# Patient Record
Sex: Female | Born: 1965 | Hispanic: Yes | Marital: Single | Smoking: Never smoker
Health system: Southern US, Community
[De-identification: ages and names within clinical notes are randomized; demographics above are authoritative.]

## PROBLEM LIST (undated history)

## (undated) DIAGNOSIS — M79644 Pain in right finger(s): Secondary | ICD-10-CM

## (undated) DIAGNOSIS — J309 Allergic rhinitis, unspecified: Secondary | ICD-10-CM

## (undated) HISTORY — PX: OTHER SURGICAL HISTORY: SHX169

## (undated) HISTORY — DX: Allergic rhinitis, unspecified: J30.9

## (undated) HISTORY — PX: OVARIAN CYST REMOVAL: SHX89

---
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
## 1996-02-17 HISTORY — PX: BREAST LUMPECTOMY: SHX2

## 2013-01-09 ENCOUNTER — Encounter: Payer: Self-pay | Admitting: Family Medicine

## 2013-01-09 ENCOUNTER — Ambulatory Visit (INDEPENDENT_AMBULATORY_CARE_PROVIDER_SITE_OTHER): Payer: BC Managed Care – PPO | Admitting: Family Medicine

## 2013-01-09 VITALS — BP 120/81 | HR 87 | Ht 64.0 in | Wt 132.0 lb

## 2013-01-09 DIAGNOSIS — Z13220 Encounter for screening for lipoid disorders: Secondary | ICD-10-CM

## 2013-01-09 DIAGNOSIS — Z23 Encounter for immunization: Secondary | ICD-10-CM

## 2013-01-09 DIAGNOSIS — Z0000 Encounter for general adult medical examination without abnormal findings: Secondary | ICD-10-CM

## 2013-01-09 NOTE — Assessment & Plan Note (Signed)
Healthy 47 yr Female, no acute / chronic complaints  Plan: 1. Ordered future lab, fasting lipid panel (screen for hypercholesterolemia) - will calculate ASCVD risk, suspect low at this time (w/o significant risk factors) 2. Recommend local GYN to establish care w/ in future 3. Will need scheduled annual mammogram (2015) 4. Received influenza vaccine (01/09/13) 5. Recommend Td vs Tdap in 2015 (last in 2005)

## 2013-01-09 NOTE — Progress Notes (Signed)
Subjective:     Patient ID: Desiree Pruitt, female   DOB: [birth year], 47 y.o.   MRN: [id number]  New patient visit to establish care.  HPI  General Health Screening Presents today without any health complaints, interested in establishing care and meeting new doctor in area. Denies any acute or chronic medical problems. Does not take any rx medications, OTC, or supplements. Interested in preventative health and screening. Good health literacy, with multiple family members physicians (Dad retired Investment banker, operational, brother Development worker, international aid). Denies any h/o hypertension or hypercholesterolemia. Unsure last time a complete lipid panel checked. Lifestyle - exercise regularly (dance, swimming, yoga 1-3x weekly), diet (well-balanced)  Health Maintenance - Regular mammograms yearly - Hx regular pap smears (denies any abnormal results) - Interested in recommendation of GYN in area for future visits - Recently had annual physical completed in May 2014  Social Hx: Originally from [place]. Recently moved to [place] (from [place]). 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 - Denies any tobacco or drug use. Never smoker - Admits to occasional alcohol use infrequent wine / beer 2-3x weekly - Feels safe at home  PHQ-2 - negative  Review of Systems  Denies any recent illness, fever/chills, headache, chest pain, dyspnea, cough, vision problems, nausea / vomiting, abdominal pain, constipation / diarrhea, numbness / tingling, weakness, joint/muscle pain.     Objective:   Physical Exam  BP 120/81  Pulse 87  Ht 5\' 4"  (1.626 m)  Wt 132 lb (59.875 kg)  BMI 22.65 kg/m2  General - very pleasant, conversational, well-appearing 47 yr Female, NAD HEENT - PERRLA, EOMI, nares patent w/o congestion, pharynx clear, MMM Neck - supple, non-tender, no LAD Heart - RRR, no murmurs Lungs - CTAB Abd - soft, NTND, +active BS Ext - WWP, +2  peripheral pulses b/l Neuro - awake, alert, oriented, grossly non-focal, intact muscle strength 5/5, intact distal sensation to light touch, normal gait Skin - warm, dry, no rashes     Assessment:     See specific A&P problem list for details.      Plan:     See specific A&P problem list for details.

## 2013-01-09 NOTE — Patient Instructions (Signed)
Dear Lynett Grimes, Thank you for coming in to clinic today. It was good to meet you!  Today we discussed your general and preventative health. 1. I really enjoyed talking to you, and wish you well! 2. Your blood pressure is excellent, and there are no other indications for any labs or medicines at this time. 3. We will check a fasting cholesterol panel within 1 week. Please do not eat after midnight prior to your appointment. I will review the lab and send you a letter with the results. We may call you if there is any concern to discuss the results further if needed. 4. I will not make a GYN referral today, but will look into a good recommendation for you to get established with a local GYN. 5. Continue to exercise and eat healthy and   Some important numbers from today's visit: BP - 120/81  Please schedule a follow-up appointment with me in 6 months - general medical visit.   If you have any other questions or concerns, please feel free to call the clinic to contact me. You may also schedule an earlier appointment if necessary.  However, if your symptoms get significantly worse, please go to the Emergency Department to seek immediate medical attention.  Saralyn Pilar, DO [facility]

## 2013-02-28 ENCOUNTER — Encounter: Payer: Self-pay | Admitting: Family Medicine

## 2013-02-28 ENCOUNTER — Ambulatory Visit (INDEPENDENT_AMBULATORY_CARE_PROVIDER_SITE_OTHER): Payer: BC Managed Care – PPO | Admitting: Family Medicine

## 2013-02-28 VITALS — BP 124/82 | HR 98 | Temp 98.2°F | Ht 64.0 in | Wt 134.0 lb

## 2013-02-28 DIAGNOSIS — N898 Other specified noninflammatory disorders of vagina: Secondary | ICD-10-CM | POA: Insufficient documentation

## 2013-02-28 DIAGNOSIS — R197 Diarrhea, unspecified: Secondary | ICD-10-CM

## 2013-02-28 DIAGNOSIS — J309 Allergic rhinitis, unspecified: Secondary | ICD-10-CM

## 2013-02-28 DIAGNOSIS — J069 Acute upper respiratory infection, unspecified: Secondary | ICD-10-CM

## 2013-02-28 DIAGNOSIS — N9489 Other specified conditions associated with female genital organs and menstrual cycle: Secondary | ICD-10-CM

## 2013-02-28 DIAGNOSIS — B9789 Other viral agents as the cause of diseases classified elsewhere: Secondary | ICD-10-CM

## 2013-02-28 MED ORDER — FLUTICASONE PROPIONATE 50 MCG/ACT NA SUSP: 1.0000 | Freq: Every day | NASAL | Status: DC | PRN

## 2013-02-28 MED ORDER — BENZONATATE 100 MG PO CAPS: 100.0000 mg | ORAL_CAPSULE | Freq: Two times a day (BID) | ORAL | Status: DC | PRN

## 2013-02-28 NOTE — Assessment & Plan Note (Signed)
Symptoms consistent with persistent URI, related to nasal congestion / drip and secondary sore throat. Currently afebrile, no other red flags on exam.  Plan: 1. Start Fluticasone nasal spray daily x 2 week to 1 month 2. OTC Nasal saline, consider Claritin

## 2013-02-28 NOTE — Assessment & Plan Note (Signed)
Self-limited episode during recent acute illness, since resolved. Multiple days of dryness without improvement on arousal or intercourse. Currently peri-menopausal still with period. Suspect related to menopausal changes.  Plan: 1. Referral to GYN for establishing management. Patient had previously expressed interest in finding local GYN, as she has recently moved to area.

## 2013-02-28 NOTE — Patient Instructions (Signed)
Dear Desiree Pruitt, Thank you for coming in to clinic today. It was good to see you again!  Today we discussed your Recent Illnesses and Diarrhea. 1. It sounds like you've had more than 1 viral infection over the past few weeks. 2. Since your fever and diarrhea has stopped, I am reassured that you will not require any further treatment at this time. If the diarrhea returns, or you notice blood in stool or fever, then please call and come back and we can collect stool cultures at that time, and possibly start you on an antibiotic. 3. For your sore throat, cough, and nasal drip, I have given a rx for Flonase and Tessalon Perls for cough. You may also try an over the counter anti-histamine like Claritin. 4. I would recommend continuing the Flonase for about 2 weeks to 1 month. You may continue the saline spray. 5. For sore throat, please take Ibuprofen 200-400mg  every 4 to 6 hours as needed.  Please schedule a follow-up appointment with me as needed if your symptoms return.  If you have any other questions or concerns, please feel free to call the clinic to contact me. You may also schedule an earlier appointment if necessary.  However, if your symptoms get significantly worse, please go to the Emergency Department to seek immediate medical attention.  Nobie Putnam, DO  Family Medicine    Viral Gastroenteritis Viral gastroenteritis is also known as stomach flu. This condition affects the stomach and intestinal tract. It can cause sudden diarrhea and vomiting. The illness typically lasts 3 to 8 days. Most people develop an immune response that eventually gets rid of the virus. While this natural response develops, the virus can make you quite ill. CAUSES  Many different viruses can cause gastroenteritis, such as rotavirus or noroviruses. You can catch one of these viruses by consuming contaminated food or water. You may also catch a virus by sharing utensils or other personal items  with an infected person or by touching a contaminated surface. SYMPTOMS  The most common symptoms are diarrhea and vomiting. These problems can cause a severe loss of body fluids (dehydration) and a body salt (electrolyte) imbalance. Other symptoms may include:  Fever.  Headache.  Fatigue.  Abdominal pain. DIAGNOSIS  Your caregiver can usually diagnose viral gastroenteritis based on your symptoms and a physical exam. A stool sample may also be taken to test for the presence of viruses or other infections. TREATMENT  This illness typically goes away on its own. Treatments are aimed at rehydration. The most serious cases of viral gastroenteritis involve vomiting so severely that you are not able to keep fluids down. In these cases, fluids must be given through an intravenous line (IV). HOME CARE INSTRUCTIONS   Drink enough fluids to keep your urine clear or pale yellow. Drink small amounts of fluids frequently and increase the amounts as tolerated.  Ask your caregiver for specific rehydration instructions.  Avoid:  Foods high in sugar.  Alcohol.  Carbonated drinks.  Tobacco.  Juice.  Caffeine drinks.  Extremely hot or cold fluids.  Fatty, greasy foods.  Too much intake of anything at one time.  Dairy products until 24 to 48 hours after diarrhea stops.  You may consume probiotics. Probiotics are active cultures of beneficial bacteria. They may lessen the amount and number of diarrheal stools in adults. Probiotics can be found in yogurt with active cultures and in supplements.  Wash your hands well to avoid spreading the virus.  Only  take over-the-counter or prescription medicines for pain, discomfort, or fever as directed by your caregiver. Do not give aspirin to children. Antidiarrheal medicines are not recommended.  Ask your caregiver if you should continue to take your regular prescribed and over-the-counter medicines.  Keep all follow-up appointments as directed by  your caregiver. SEEK IMMEDIATE MEDICAL CARE IF:   You are unable to keep fluids down.  You do not urinate at least once every 6 to 8 hours.  You develop shortness of breath.  You notice blood in your stool or vomit. This may look like coffee grounds.  You have abdominal pain that increases or is concentrated in one small area (localized).  You have persistent vomiting or diarrhea.  You have a fever. MAKE SURE YOU:   Understand these instructions.  Will watch your condition.  Will get help right away if you are not doing well or get worse. Document Released: 02/02/2005 Document Revised: 04/27/2011 Document Reviewed: 11/19/2010 [facility] Patient Information 2014 [place].

## 2013-02-28 NOTE — Assessment & Plan Note (Signed)
Concern for infectious etiology given foreign travel ([place]) and febrile illness (however possible fever 2/2 viral illness). Currently diarrhea resolved x 2 days (regular BMs), no blood or mucus, residual abdominal cramping. No other red flags at this time. Likely viral vs self limited infectious colitis  Plan: 1. No indication for stool culture or o&p and no empiric antibiotics 2. If symptoms resume and persist, low threshold for RTC for stool culture / o&p. May consider empiric Cipro. Avoid anti-diarrheal agents 3. Continue probiotics 3.

## 2013-02-28 NOTE — Assessment & Plan Note (Addendum)
Recurrent viral URI, with noted improvements. Currently afebrile, no red flags on exam. No indication for throat swab today.  Plan: 1. No antibiotics at this time 2. Flonase, nasal saline, claritin, tessalon perls, ibuprofen PRN sore throat 3. RTC 1-2 weeks if not improved

## 2013-02-28 NOTE — Progress Notes (Signed)
Subjective:     Patient ID: Desiree Pruitt, female   DOB: 10-18-1965, 48 y.o.   MRN: [id number]  Patient presented for a same day visit.  HPI  URI SYMPTOMS: Reports recent intermittent course of URI symptoms starting 02/09/13 with cough, nasal congestion, reported fevers, and sore throat. Overall, URI symptoms improved prior to travel to [place], and then course returned recently on 02/23/13 with fever, sore throat, cough, and nasal congestion, has been without fever for past 2 days, noted improvement in sore throat with persistent cough worse at night. Tried over the counter anti-pyretic with some relief.  DIARRHEA: Currently has had no further episodes diarrhea for past 2 days, noted semi-solid regular appearing stools 1-2x daily, associated with improved abdominal cramping only before/during BM, asymptomatic between BMs. However, reported recent course of diarrhea (02/18/13 to 02/26/13) with frequent 3x episodes daily, mostly liquid, associated with urgency, also had fever. Concern with symptoms starting while in [place], however denies any friends/family with similar symptoms, and unable to isolate specific inciting food. Started Probiotics, previously ate a bland diet, has since tolerated more variety of foods, continues to increase fluid intake. Denies any blood, mucus, foul odor, or incontinence.  VAGINAL DRYNESS: Recent history of vaginal dryness for few days during course of recent diarrhea episode, especially when aroused and with intercourse, noted minimal natural vaginal lubrication compared to normal. Note she is perimenopausal and still gets her period. Within the past 1 week her symptoms of dryness have improved. Requesting GYN referral to establish care, as she is new to the area.  Social Hx: Never smoker  Travel Hx: to [place] (native) from 02/11/13 to 02/21/13, denies any specific exposures or concerning ingestions.  Review of Systems  As above HPI     Objective:   Physical  Exam  BP 124/82  Pulse 98  Temp(Src) 98.2 F (36.8 C) (Oral)  Ht 5\' 4"  (1.626 m)  Wt 134 lb (60.782 kg)  BMI 22.99 kg/m2  LMP 02/14/2013  Gen - well-appearing, NAD HEENT - no significant sinus tenderness, patent nares minimal congestion, b/l nasal turbinates red and swollen, oropharynx with post-nasal drip otherwise clear of erythema and no exudate, MMM Neck - supple, R-sided tenderness, no LAD Heart - RRR, no murmurs heard Lungs - CTAB, no wheezing, crackles, or rhonchi. Normal work of breathing. Abd - soft, NTND, no masses, +active BS Ext - non-tender, no edema, peripheral pulses intact +2 b/l Skin - warm, dry, no rashes Neuro - awake, alert, oriented, grossly non-focal, intact muscle strength 5/5 b/l, intact distal sensation to light touch, gait normal     Assessment:     See specific A&P problem list for details.      Plan:     See specific A&P problem list for details.

## 2013-03-01 ENCOUNTER — Telehealth: Payer: Self-pay | Admitting: Family Medicine

## 2013-03-01 NOTE — Telephone Encounter (Signed)
[facility] After Hours Line.   Patient called complaining of persistent sore throat worse on the right side, feels like concentrated on the tonsil on that side. Has tried 400mg  of ibuprofen twice a day for last 2 days but pain persists and has now developed right ear pain. Patient is afebrile and breathing comfortably. Discussed with patient needs repeat office evaluation and advised same day visit tomorrow. Also, advised taking 400mg  every 6 hours of ibuprofen.   Brayton Mars. Melanee Spry, MD, PGY-3 03/01/2013 7:45 PM

## 2013-03-02 ENCOUNTER — Ambulatory Visit (INDEPENDENT_AMBULATORY_CARE_PROVIDER_SITE_OTHER): Payer: BC Managed Care – PPO | Admitting: Family Medicine

## 2013-03-02 ENCOUNTER — Encounter: Payer: Self-pay | Admitting: Family Medicine

## 2013-03-02 VITALS — BP 125/88 | HR 86 | Temp 98.5°F | Ht 64.0 in | Wt 134.0 lb

## 2013-03-02 DIAGNOSIS — R070 Pain in throat: Secondary | ICD-10-CM

## 2013-03-02 DIAGNOSIS — J020 Streptococcal pharyngitis: Secondary | ICD-10-CM

## 2013-03-02 LAB — POCT RAPID STREP A (OFFICE): Rapid Strep A Screen: POSITIVE — AB

## 2013-03-02 MED ORDER — PENICILLIN V POTASSIUM 500 MG PO TABS: 500.0000 mg | ORAL_TABLET | Freq: Two times a day (BID) | ORAL | Status: DC

## 2013-03-02 MED ORDER — AMOXICILLIN 500 MG PO CAPS: 500.0000 mg | ORAL_CAPSULE | Freq: Two times a day (BID) | ORAL | Status: DC

## 2013-03-02 NOTE — Assessment & Plan Note (Addendum)
Rapid strep positive, clinically supportive with pharynx worsened today (compared to 02/28/13), inc erythema, edema, with mild exudates R>L, appears symmetric without evidence of peri-tonsilar abscess. +new worsened R-submandibular node Note age, +cough, afebrile, less supportive of strep throat. Also, given h/o prior Strep 10 yrs ago, pt may be colonizer, etiology could still be viral vs alternative bacteria  Plan: 1. Amoxicillin PO 500mg  BID x 10 days. Treat as GAS pharyngitis given +rapid strep, initially discussed using Pencillin VK (pt preference for antibiotics to minimize gut flora loss, but ultimately agreed for wider coverage for potential alternative pathogens). 2. Continue Ibuprofen 400mg  q 6 hr 3. Advised to call/RTC if no improvement in 3-4 days, consider broadening coverage

## 2013-03-02 NOTE — Progress Notes (Signed)
Subjective:     Patient ID: Desiree Pruitt, female   DOB: 07/03/1965, 48 y.o.   MRN: [id number]  HPI  SORE THROAT / RIGHT EAR PAIN Reports significant worsening R-ear / throat pain over past 48 hours, painful swallowing solids/liquids, limited diet to soup, recently seen by me in clinic on 02/28/13. Has been taking Ibuprofen 400mg  twice daily for 2 days with notable relief, and then since last night has been using it every 6 hours, but reports it wears off after about 3 hours. Note the pain woke her up overnight, and she had difficulty sleeping. Additionally, noticed new swollen painful lymph node on right anterior neck. Did not go to work today because of pain. Note recent diarrhea has remained resolved x 4 days now, and would prefer less harsh antibiotics that would not disturb her gut flora.  PMH: Reports prior history of Strep throat 10 years ago (last time she got flu shot)  Allergies: Note reports allergic reaction to unknown antibiotic in [place] years ago, with high fever and diarrhea. Believes she has had Penicillin before without problems, as she has never had any other problem with antibiotics, but cannot confirm.  Social Hx: Never smoker  Review of Systems  See above HPI Additionally: Admits cough (worse at night, relieved by Tessalon), congestion. Denies fever, chills, HA, CP, dyspnea, hoarseness, nausea, vomiting, diarrhea.    Objective:   Physical Exam  BP 125/88  Pulse 86  Temp(Src) 98.5 F (36.9 C) (Oral)  Ht 5\' 4"  (1.626 m)  Wt 134 lb (60.782 kg)  BMI 22.99 kg/m2  LMP 02/14/2013  Gen - awake, alert, pleasant, tired but well-appearing, NAD  HEENT - PERRL, bilateral TM's normal without erythema, effusion or bulging (note inc tenderness on R-otoscopic exam without evidence of ear canal lesions, external ear non-tender), bilateral mastoid processes non-tender without erythema, patent nares mild congestion, oropharynx with post-nasal drip otherwise clear of erythema  and no exudate, MMM  Neck - supple, Right anterior LAD with significant 1-2cm submandibular node, tender to palpation R>L Heart - RRR, no murmurs heard  Lungs - CTAB, no wheezing, crackles, or rhonchi. Normal work of breathing.  Skin - warm, dry, no rashes      Assessment:     See specific A&P problem list for details.      Plan:     See specific A&P problem list for details.

## 2013-03-02 NOTE — Patient Instructions (Addendum)
Dear Desiree Pruitt, Thank you for coming in to clinic today. It was good to see you again, sorry you are back so soon and not feeling better!  Today we discussed your Sore Throat and Ear Pain. 1. On exam, your throat does seem like it may be the source of your infection, your ear looked normal. 2. The rapid strep test was positive, indicating that you do have Strep Throat. 3. We will start with antibiotics today with Amoxicillin. 4. You may continue taking the Ibuprofen for pain.  We started a new medication today to treat your Strep Throat. Amoxicillin 500 mg, please take 1 tablet 2 times a day for 10 days. If you are not improving, please call the clinic in 3-4 days.  Please schedule a follow-up appointment with me or any available provider in 1 week if your symptoms are not improving.  [facility] [phone] Urgent Care [phone] Emergency Department [phone]  If you have any other questions or concerns, please feel free to call the clinic to contact me. You may also schedule an earlier appointment if necessary.  However, if your symptoms get significantly worse, please go to the Emergency Department to seek immediate medical attention.  Nobie Putnam, [place]

## 2013-03-09 ENCOUNTER — Encounter: Payer: Self-pay | Admitting: Obstetrics & Gynecology

## 2013-03-24 ENCOUNTER — Encounter: Payer: Self-pay | Admitting: Family Medicine

## 2013-03-24 ENCOUNTER — Ambulatory Visit (INDEPENDENT_AMBULATORY_CARE_PROVIDER_SITE_OTHER): Payer: BC Managed Care – PPO | Admitting: Family Medicine

## 2013-03-24 VITALS — BP 120/80 | HR 80 | Temp 98.6°F | Ht 64.0 in | Wt 134.4 lb

## 2013-03-24 DIAGNOSIS — J020 Streptococcal pharyngitis: Secondary | ICD-10-CM

## 2013-03-24 DIAGNOSIS — R070 Pain in throat: Secondary | ICD-10-CM

## 2013-03-24 LAB — POCT MONO (EPSTEIN BARR VIRUS): Mono, POC: NEGATIVE

## 2013-03-24 LAB — POCT RAPID STREP A (OFFICE): Rapid Strep A Screen: POSITIVE — AB

## 2013-03-24 MED ORDER — CEPHALEXIN 500 MG PO CAPS: 500.0000 mg | ORAL_CAPSULE | Freq: Two times a day (BID) | ORAL | Status: DC

## 2013-03-24 MED ORDER — FLUCONAZOLE 150 MG PO TABS
150.0000 mg | ORAL_TABLET | Freq: Once | ORAL | Status: DC
Start: 2013-03-24 — End: 2013-06-17

## 2013-03-24 NOTE — Patient Instructions (Signed)
Strep Throat  Strep throat is an infection of the throat caused by a bacteria named Streptococcus pyogenes. Your caregiver may call the infection streptococcal "tonsillitis" or "pharyngitis" depending on whether there are signs of inflammation in the tonsils or back of the throat. Strep throat is most common in children aged 47 15 years during the cold months of the year, but it can occur in people of any age during any season. This infection is spread from person to person (contagious) through coughing, sneezing, or other close contact.  SYMPTOMS   · Fever or chills.  · Painful, swollen, red tonsils or throat.  · Pain or difficulty when swallowing.  · White or yellow spots on the tonsils or throat.  · Swollen, tender lymph nodes or "glands" of the neck or under the jaw.  · Red rash all over the body (rare).  DIAGNOSIS   Many different infections can cause the same symptoms. A test must be done to confirm the diagnosis so the right treatment can be given. A "rapid strep test" can help your caregiver make the diagnosis in a few minutes. If this test is not available, a light swab of the infected area can be used for a throat culture test. If a throat culture test is done, results are usually available in a day or two.  TREATMENT   Strep throat is treated with antibiotic medicine.  HOME CARE INSTRUCTIONS   · Gargle with 1 tsp of salt in 1 cup of warm water, 3 4 times per day or as needed for comfort.  · Family members who also have a sore throat or fever should be tested for strep throat and treated with antibiotics if they have the strep infection.  · Make sure everyone in your household washes their hands well.  · Do not share food, drinking cups, or personal items that could cause the infection to spread to others.  · You may need to eat a soft food diet until your sore throat gets better.  · Drink enough water and fluids to keep your urine clear or pale yellow. This will help prevent dehydration.  · Get plenty of  rest.  · Stay home from school, daycare, or work until you have been on antibiotics for 24 hours.  · Only take over-the-counter or prescription medicines for pain, discomfort, or fever as directed by your caregiver.  · If antibiotics are prescribed, take them as directed. Finish them even if you start to feel better.  SEEK MEDICAL CARE IF:   · The glands in your neck continue to enlarge.  · You develop a rash, cough, or earache.  · You cough up green, yellow-brown, or bloody sputum.  · You have pain or discomfort not controlled by medicines.  · Your problems seem to be getting worse rather than better.  SEEK IMMEDIATE MEDICAL CARE IF:   · You develop any new symptoms such as vomiting, severe headache, stiff or painful neck, chest pain, shortness of breath, or trouble swallowing.  · You develop severe throat pain, drooling, or changes in your voice.  · You develop swelling of the neck, or the skin on the neck becomes red and tender.  · You have a fever.  · You develop signs of dehydration, such as fatigue, dry mouth, and decreased urination.  · You become increasingly sleepy, or you cannot wake up completely.  Document Released: 01/31/2000 Document Revised: 01/20/2012 Document Reviewed: 04/03/2010  [employer]® Patient Information ©2014 [employer].

## 2013-03-25 ENCOUNTER — Encounter: Payer: Self-pay | Admitting: Family Medicine

## 2013-03-25 NOTE — Progress Notes (Signed)
Patient ID: Desiree Pruitt    DOB: 1965-03-23, 48 y.o.   MRN: [id number] --- Subjective:  Desiree Pruitt is a 48 y.o.female who presents with recurrence of throat pain.  Patient was seen on 03/02/13 and found to be strep positive for which she was treated with amoxicillin. She reports finishing the course of antibiotics. Symptoms resolved after antibiotic treatment. She started feeling right sided throat pain the night prior to today's visit and she wanted to get checked out for it before it got any worst.  She states that pain is sharp, intermittent. Doesn't prevent her from swallowing or talking. No associated fevers. No weight loss. She denies any debilitating fatigue. No myalgias. No rhinorrhea or nasal congestion. No cough.  She also reports that for the last 2 months she has been sexually involved with a female partner with whom she has had oral sex and she expresses concern that it could be related. She states that they have both been tested about a month ago for STD's, including HIV and GC/Chl   ROS: see HPI Past Medical History: reviewed and updated medications and allergies. Social History: Tobacco: none  Objective: Filed Vitals:   03/24/13 1106  BP: 120/80  Pulse: 80  Temp: 98.6 F (37 C)    Physical Examination:   General appearance - alert, well appearing, and in no distress Ears - bilateral TM's and external ear canals normal Nose - normal and patent, no erythema, discharge or polyps Mouth - mucous membranes moist, oropharynx erythematous with cobblestone pattern, tonsils erythematous without exudate bilaterally Neck - supple, tender right cervical lymphadenopathy Chest - clear to auscultation, no wheezes, rales or rhonchi, symmetric air entry Heart - normal rate, regular rhythm, normal S1, S2, no murmurs

## 2013-03-25 NOTE — Assessment & Plan Note (Signed)
Strep test positive after initial treatment with amoxicillin.  This could be due to chronic carrier state or to recurrence of infection given that patient is symptomatic.  - mono spot checked to rule out mono as etiology of pharyngitis and negative - will treat with different antibiotic: keflex 500mg  bid for 10 days. If patient has recurrence of symptoms, she was instructed to return to clinic for referal to ENT for further evaluation.  - diflucan ordered in case of yeast infection which patient states she does have with antibiotics - encouraged probiotics for prevention of diarrhea with antibiotics which patient was concerned about

## 2013-04-13 ENCOUNTER — Encounter: Payer: BC Managed Care – PPO | Admitting: Obstetrics & Gynecology

## 2013-05-05 ENCOUNTER — Encounter: Payer: BC Managed Care – PPO | Admitting: Family Medicine

## 2013-05-24 ENCOUNTER — Ambulatory Visit (INDEPENDENT_AMBULATORY_CARE_PROVIDER_SITE_OTHER): Payer: BC Managed Care – PPO | Admitting: Obstetrics & Gynecology

## 2013-05-24 ENCOUNTER — Encounter: Payer: Self-pay | Admitting: Obstetrics & Gynecology

## 2013-05-24 VITALS — BP 111/78 | HR 85 | Temp 99.8°F | Ht 64.0 in | Wt 135.8 lb

## 2013-05-24 DIAGNOSIS — Z01419 Encounter for gynecological examination (general) (routine) without abnormal findings: Secondary | ICD-10-CM

## 2013-05-24 MED ORDER — FLUCONAZOLE 150 MG PO TABS: 150.0000 mg | ORAL_TABLET | Freq: Once | ORAL | Status: DC

## 2013-05-24 NOTE — Patient Instructions (Signed)
Breast Cancer, What You Should Know Breast cancer is one of the most common types of cancer in women. It is the second leading cause of death for all women.  The probability that breast cancer will return after treatment is directly related to the stage of its malignancy. This means how advanced the cancer is before it is found and treated. If breast cancer is found and treated early, before the cancer has spread to the lymph nodes, your chance for survival is much better. Delays in diagnosing or treating breast cancer can result in spread of the cancer. This happens when warning signs are ignored and proper measures for diagnosis or treatment are not taken. When breast cancer spreads beyond the breast to other parts of the body, staging is done in order to find out the extent of the cancer and to give the best treatment available. Today, there is a better chance of survival in the treatment of breast cancer. In the past 20 to 30 years, the diagnosis and treatment of breast cancer has greatly improved. There are more options for the type of surgery available (not just radical breast removal [mastectomy] and removal of lymph nodes in the armpit). There are also improved medicines, chemotherapy, radiation therapy, and breast reconstruction. These innovations have improved the survival rate and have helped with complications of breast cancer that were present in the past. Radiation, chemotherapy, and medicines may be used before or after surgery to shrink the tumor, kill any remaining cancer cells, and to prevent spreading and recurrence of the cancer. Research to develop new medicines, procedures, and combinations of treatments is constantly being done to help improve prevention, treatment, and to reduce recurrence of breast cancer. TYPES OF BREAST CANCER  In situ. Cancer is contained in the tumor and has not spread.  Invasive. Cancer has spread outside the tumor.  Inflammatory. The whole breast is red  (inflamed), painful, and swollen (rare).  Paget's disease. Cancer starts in the nipple and spreads to the areola (rare).  Breast cancer in the milk ducts.  Breast cancer in the milk lobules. Males can get breast cancer, but it is very rare. RISK FACTORS There are certain conditions and circumstances that place some women at risk for developing breast cancer. If you have any or several of these risks, you should be aware of them and take extra precautions to take better care of yourself. Some of these risk factors include:  Previous history of breast cancer.  Family history of breast cancer.  Abnormal genes present in your body (BRCA 1, BRCA 2, and HER-2).  Calcium deposits (calcifications) seen on your breast X-ray (mammogram).  Starting your menstrual periods before age 16 (early menarche).  Late menopause, at age 11 or older.  Heavy radiation exposure to the chest.  Cancer of the uterus, ovary, or intestine.  Drinking too much alcohol.  Never having a baby or breastfeeding.  Taking too much hormone treatment for too long.  Being very tall.  Being of Jewish descent.  Obesity.  Presence of estrogen or progesterone receptor cells in the breast.  Smoking. Low-dose birth control pills and fibrocystic disease of the breast are not thought to cause breast cancer. MANAGING BREAST PROBLEMS The following are steps that should be taken to avoid a bad outcome with a breast problem:  You should practice "breast self-awareness." This means understanding the normal appearance and feel of your breasts and may include breast self-exams. Any changes detected, no matter how small, should be reported to  your caregiver. Women in their 47s and 30s should have a clinical breast exam (CBE) by a caregiver as part of a regular health exam every 1 to 3 years. After age 47, women should have a CBE every year.  All lumps should be evaluated thoroughly by ultrasound, mammogram, magnetic resonance  imaging (MRI) scan, tissue sample (biopsy) exam, or removed and examined for cancer.  If you are told you have a breast infection, make sure you follow up with your caregiver until it gets better and goes away.  If you are told a tumor is benign (noncancerous), question the diagnosis and ask for a biopsy to make sure.  All tumors should be removed and examined for cancer.  Do not disregard sharp pains in your breast. Make sure you have an answer for what is causing the pain and follow up properly.  If you have signs of pulling in (retraction) of your nipple, make sure you understand the reason for this and that proper studies are done.  If you have signs of discharge from your nipple, especially blood, make sure you are told the reason for this and that proper tests are done.  If a needle aspiration biopsy is done and is negative, make sure you know when to follow up with this, and when repeat testing should be done. Get a second opinion if necessary.  Do not rely only on mammograms. Be sure to have regular self-exams and physical breast exams by your caregiver.  Have a mammogram done on a regular basis, or as recommended. The [place] has guidelines based on age and risk factors that may be present.  If there are findings from your mammogram, make sure you follow up properly. Make sure you know when additional testing needs to be done. You should know that:  Radiation from mammograms does not cause cancer or other problems to the breast, skin, heart, or lungs.  Other screening tests for the breast are ultrasound and MRI scans.  There are medicines available that may help prevent breast cancer or help prevent recurrence in women at high risk, 47 years old or older. These medicines block estrogen hormone from getting into the tumor:  Tamoxifen.  Raloxifene.  Trastuzumab.  Sometimes, the ovaries will be removed to decrease estrogen or progesterone hormone production.  These hormones may stimulate the growth, recurrence, or spread of breast cancer. Finding out the results of your test When you have breast testing done, ask when your test results will be ready. Make sure you get your test results. FOR MORE INFORMATION American Cancer Society: www.cancer.org Document Released: 05/13/2005 Document Revised: 04/27/2011 Document Reviewed: 12/20/2008 [facility] Patient Information 2014 [place], [place].

## 2013-05-24 NOTE — Progress Notes (Signed)
Patient ID: Desiree Pruitt, female   DOB: 1965-09-11, 48 y.o.   MRN: [id number] Subjective:     Desiree Pruitt is a 48 y.o. female here for a routine exam.  LMP 2 weeks prev  G0 Current complaints: freq yeast infxn.  Pt was on atbx Amox and Keflex x 2 and she began having freq yeast infxn. Started having intercourse without condoms recently.  Had STI testing prior to intercourse without condoms.  This is a new partner since having female only relationships for 15 years.        Gynecologic History No LMP recorded. Contraception: vasectomy Last Pap: 2014. Results were: normal Last mammogram: 2014. Results were: normal  Obstetric History OB History  No data available     The following portions of the patient's history were reviewed and updated as appropriate: allergies, current medications, past family history, past medical history, past social history, past surgical history and problem list.  Review of Systems Genitourinary:negative, recurent yeast infections after 2 courses of atbx    Objective:    BP 111/78  Pulse 85  Temp(Src) 99.8 F (37.7 C) (Oral)  Ht 5\' 4"  (1.626 m)  Wt 135 lb 12.8 oz (61.598 kg)  BMI 23.30 kg/m2  General Appearance:    Alert, cooperative, no distress, appears stated age                 Neck:   Supple, symmetrical, trachea midline, no adenopathy;    thyroid:  no enlargement/tenderness/nodules; no carotid   bruit or JVD  Back:     Symmetric, no curvature, ROM normal, no CVA tenderness  Lungs:     Clear to auscultation bilaterally, respirations unlabored  Chest Wall:    No tenderness or deformity   Heart:    Regular rate and rhythm, S1 and S2 normal, no murmur, rub   or gallop  Breast Exam:    No tenderness, masses, or nipple abnormality  Abdomen:     Soft, non-tender, bowel sounds active all four quadrants,    no masses, no organomegaly  Genitalia:    Normal female without lesion, discharge or tenderness  Rectal:    Normal tone, normal  prostate, no masses or tenderness;   guaiac negative stool  Extremities:   Extremities normal, atraumatic, no cyanosis or edema  Pulses:   2+ and symmetric all extremities  Skin:   Skin color, texture, turgor normal, no rashes or lesions            Assessment:    Healthy female exam.    Plan:    Follow up in: 3 months.  to check and see if yeast has resolved.  Pt told to cancel visit if her sx have resolved Diflucan 150mg  1 po q 3 days x3

## 2013-05-24 NOTE — Progress Notes (Signed)
Pt. Here for annual exam today and has a few concerns: frequent yeast infections, "once a month," one two weeks ago and has one now.C/o of cottage cheese like discharge. Pt. Reports being tested recently for GC/ch. Also c/o of painful intercourse at different times in cycle.

## 2013-06-12 ENCOUNTER — Ambulatory Visit (HOSPITAL_COMMUNITY)
Admission: RE | Admit: 2013-06-12 | Discharge: 2013-06-12 | Disposition: A | Discharge status: Home / Self Care | Payer: BC Managed Care – PPO | Source: Ambulatory Visit | Attending: Obstetrics & Gynecology | Admitting: Obstetrics & Gynecology

## 2013-06-12 DIAGNOSIS — Z01419 Encounter for gynecological examination (general) (routine) without abnormal findings: Secondary | ICD-10-CM

## 2013-06-12 DIAGNOSIS — Z1231 Encounter for screening mammogram for malignant neoplasm of breast: Secondary | ICD-10-CM | POA: Insufficient documentation

## 2013-06-17 ENCOUNTER — Ambulatory Visit (INDEPENDENT_AMBULATORY_CARE_PROVIDER_SITE_OTHER): Payer: BC Managed Care – PPO | Admitting: Family Medicine

## 2013-06-17 VITALS — BP 112/70 | HR 87 | Temp 98.7°F | Resp 16 | Ht 64.0 in | Wt 137.0 lb

## 2013-06-17 DIAGNOSIS — N390 Urinary tract infection, site not specified: Secondary | ICD-10-CM

## 2013-06-17 DIAGNOSIS — R399 Unspecified symptoms and signs involving the genitourinary system: Secondary | ICD-10-CM

## 2013-06-17 LAB — POCT URINALYSIS DIPSTICK
Bilirubin, UA: NEGATIVE
Glucose, UA: NEGATIVE
Ketones, UA: NEGATIVE
Nitrite, UA: NEGATIVE
Protein, UA: NEGATIVE
Spec Grav, UA: <=1.005
Urobilinogen, UA: 0.2
pH, UA: 5.5

## 2013-06-17 LAB — POCT UA - MICROSCOPIC ONLY
Bacteria, U Microscopic: NEGATIVE
Casts, Ur, LPF, POC: NEGATIVE
Crystals, Ur, HPF, POC: NEGATIVE
Mucus, UA: NEGATIVE
Yeast, UA: NEGATIVE

## 2013-06-17 MED ORDER — CIPROFLOXACIN HCL 250 MG PO TABS
250.0000 mg | ORAL_TABLET | Freq: Two times a day (BID) | ORAL | Status: DC
Start: 2013-06-17 — End: 2013-11-26

## 2013-06-17 NOTE — Patient Instructions (Signed)
Urinary Tract Infection  Urinary tract infections (UTIs) can develop anywhere along your urinary tract. Your urinary tract is your body's drainage system for removing wastes and extra water. Your urinary tract includes two kidneys, two ureters, a bladder, and a urethra. Your kidneys are a pair of bean-shaped organs. Each kidney is about the size of your fist. They are located below your ribs, one on each side of your spine.  CAUSES  Infections are caused by microbes, which are microscopic organisms, including fungi, viruses, and bacteria. These organisms are so small that they can only be seen through a microscope. Bacteria are the microbes that most commonly cause UTIs.  SYMPTOMS   Symptoms of UTIs may vary by age and gender of the patient and by the location of the infection. Symptoms in young women typically include a frequent and intense urge to urinate and a painful, burning feeling in the bladder or urethra during urination. Older women and men are more likely to be tired, shaky, and weak and have muscle aches and abdominal pain. A fever may mean the infection is in your kidneys. Other symptoms of a kidney infection include pain in your back or sides below the ribs, nausea, and vomiting.  DIAGNOSIS  To diagnose a UTI, your caregiver will ask you about your symptoms. Your caregiver also will ask to provide a urine sample. The urine sample will be tested for bacteria and white blood cells. White blood cells are made by your body to help fight infection.  TREATMENT   Typically, UTIs can be treated with medication. Because most UTIs are caused by a bacterial infection, they usually can be treated with the use of antibiotics. The choice of antibiotic and length of treatment depend on your symptoms and the type of bacteria causing your infection.  HOME CARE INSTRUCTIONS   If you were prescribed antibiotics, take them exactly as your caregiver instructs you. Finish the medication even if you feel better after you  have only taken some of the medication.   Drink enough water and fluids to keep your urine clear or pale yellow.   Avoid caffeine, tea, and carbonated beverages. They tend to irritate your bladder.   Empty your bladder often. Avoid holding urine for long periods of time.   Empty your bladder before and after sexual intercourse.   After a bowel movement, women should cleanse from front to back. Use each tissue only once.  SEEK MEDICAL CARE IF:    You have back pain.   You develop a fever.   Your symptoms do not begin to resolve within 3 days.  SEEK IMMEDIATE MEDICAL CARE IF:    You have severe back pain or lower abdominal pain.   You develop chills.   You have nausea or vomiting.   You have continued burning or discomfort with urination.  MAKE SURE YOU:    Understand these instructions.   Will watch your condition.   Will get help right away if you are not doing well or get worse.  Document Released: 11/12/2004 Document Revised: 08/04/2011 Document Reviewed: 03/13/2011  [employer] Patient Information 2014 [employer].

## 2013-06-17 NOTE — Progress Notes (Signed)
° °  Subjective:    Patient ID: Desiree Pruitt, female    DOB: February 10, 1966, 48 y.o.   MRN: [id number]  HPI HPI Comments: Desiree Pruitt is a 48 y.o. female who presents to the [facility] complaining of urinary urgency and frequency, ongoing for one week. Patient reports that her symptoms, including, cloudy colored urine, and a tinging pain in her lower abdomen after urinating, and abdominal cramping. Patient denies burning with urination.  Patient shares history of single UTI at the age of 50.   Patient reports recent, repeated yeast infections that have seemingly resolved. After those symptoms cleared her current symptoms presented. The patient reports initially ignoring them until last night when it dawned on her that the symptoms where characteristic of a UTI.   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   Review of Systems  Gastrointestinal: Positive for abdominal pain.  Genitourinary: Positive for urgency and frequency.       Objective:   Physical Exam No acute distress, articulate woman Abdomen: Soft nontender without HSM or masses CVA: Nontender Results for orders placed in visit on 03/24/13  POCT RAPID STREP A (OFFICE)      Result Value Ref Range   Rapid Strep A Screen Positive (*) Negative  POCT MONO (EPSTEIN BARR VIRUS)      Result Value Ref Range   Mono, POC Negative  Negative         Assessment & Plan:   1. UTI symptoms    UTI symptoms - Plan: Urinalysis Dipstick, POCT UA - Microscopic Only, ciprofloxacin (CIPRO) 250 MG tablet, Urine culture  Signed, Robyn Haber, MD

## 2013-06-19 LAB — URINE CULTURE: Colony Count: >=100000

## 2013-11-26 ENCOUNTER — Ambulatory Visit (INDEPENDENT_AMBULATORY_CARE_PROVIDER_SITE_OTHER): Payer: BC Managed Care – PPO | Admitting: Physician Assistant

## 2013-11-26 VITALS — BP 108/78 | HR 88 | Temp 98.5°F | Resp 18 | Ht 64.0 in | Wt 138.4 lb

## 2013-11-26 DIAGNOSIS — R300 Dysuria: Secondary | ICD-10-CM

## 2013-11-26 DIAGNOSIS — N3090 Cystitis, unspecified without hematuria: Secondary | ICD-10-CM

## 2013-11-26 LAB — POCT URINALYSIS DIPSTICK
Bilirubin, UA: NEGATIVE
Glucose, UA: NEGATIVE
Ketones, UA: NEGATIVE
Nitrite, UA: POSITIVE
Protein, UA: NEGATIVE
Spec Grav, UA: 1.015
Urobilinogen, UA: 0.2
pH, UA: 6.5

## 2013-11-26 LAB — POCT UA - MICROSCOPIC ONLY
Casts, Ur, LPF, POC: NEGATIVE
Crystals, Ur, HPF, POC: NEGATIVE
Yeast, UA: NEGATIVE

## 2013-11-26 MED ORDER — CIPROFLOXACIN HCL 500 MG PO TABS: 500.0000 mg | ORAL_TABLET | Freq: Two times a day (BID) | ORAL | Status: DC

## 2013-11-26 NOTE — Progress Notes (Signed)
Subjective:    Patient ID: Desiree Pruitt, female    DOB: [birth year], 48 y.o.   MRN: [id number]  Urinary Frequency  Associated symptoms include frequency and hematuria. Pertinent negatives include no chills, flank pain, nausea or vomiting.     48 year old non-smoking female is here today for chief complaint of dysuria.  Last night, while urinating she felt a strong pain in her suprapubic area as well as at her urethra.  She notes that the urine was cloudy.  Pain directly after urination that resolves in minutes as well as the sensation of still having to urinate again.  She denies fever, back pain, nausea, or vomiting.   She took advil last night which helped.  Not experiencing any vaginal symptoms.  She states that she has never had any vaginal dryness.  She is sexually active with a long-distance relationship with a female who just came to visit last Wednesday, 11/22/2013.  She does note the last UTI occurring in May was after a sexual encounter.  She endorses not urinating after sexual intercourse.  Unsure of last LMP, though it was a few weeks ago.     Review of Systems  Constitutional: Negative for fever and chills.  Gastrointestinal: Negative for nausea and vomiting.  Genitourinary: Positive for dysuria, frequency, hematuria and pelvic pain. Negative for flank pain, vaginal discharge and vaginal pain.       Objective:   Physical Exam  Constitutional: She is oriented to person, place, and time. She appears well-developed and well-nourished. No distress.  Eyes: Conjunctivae are normal.  Cardiovascular: Normal rate, regular rhythm and normal heart sounds.   Pulmonary/Chest: Effort normal and breath sounds normal. No respiratory distress. She has no wheezes.  Abdominal: Soft. Bowel sounds are normal. There is tenderness (suprapubic tenderness). There is no CVA tenderness.  Neurological: She is alert and oriented to person, place, and time.  Psychiatric: She has a normal mood and  affect. Her behavior is normal. Judgment and thought content normal.   BP 108/78  Pulse 88  Temp(Src) 98.5 F (36.9 C) (Oral)  Resp 18  Ht 5\' 4"  (1.626 m)  Wt 138 lb 6.4 oz (62.778 kg)  BMI 23.74 kg/m2  SpO2 100%  LMP 11/12/2013   Results for orders placed in visit on 11/26/13  POCT URINALYSIS DIPSTICK      Result Value Ref Range   Color, UA yellow     Clarity, UA cloudy     Glucose, UA neg     Bilirubin, UA neg     Ketones, UA neg     Spec Grav, UA 1.015     Blood, UA large     pH, UA 6.5     Protein, UA neg     Urobilinogen, UA 0.2     Nitrite, UA positive     Leukocytes, UA large (3+)    POCT UA - MICROSCOPIC ONLY      Result Value Ref Range   WBC, Ur, HPF, POC TNTC     RBC, urine, microscopic 10-15     Bacteria, U Microscopic 2+     Mucus, UA trace     Epithelial cells, urine per micros 2-4     Crystals, Ur, HPF, POC neg     Casts, Ur, LPF, POC neg     Yeast, UA neg           Assessment & Plan:  48 year old female is here today with chief complaint of dysuria.  UA is positive for blood along with nitrites more aligned with cystitis.    Dysuria - Plan: POCT urinalysis dipstick, POCT UA - Microscopic Only, Urine culture, ciprofloxacin (CIPRO) 500 MG tablet  Will follow up with patient of urine culture results.   Advised patient of lifestyle changes regarding sexual intercourse.      Ivar Drape, PA-C [facility] 10/11/201510:18 AM

## 2013-11-26 NOTE — Patient Instructions (Signed)
-  Stay hydrated. -Remember to urinate after intercourse.

## 2013-11-26 NOTE — Progress Notes (Signed)
I was directly involved with the patient's care and agree with the physical, diagnosis and treatment plan.  

## 2013-11-29 LAB — URINE CULTURE: Colony Count: >=100000

## 2013-11-30 NOTE — Progress Notes (Signed)
lmom for pt to cb

## 2014-02-24 ENCOUNTER — Ambulatory Visit (INDEPENDENT_AMBULATORY_CARE_PROVIDER_SITE_OTHER): Payer: BC Managed Care – PPO

## 2014-02-24 ENCOUNTER — Ambulatory Visit (INDEPENDENT_AMBULATORY_CARE_PROVIDER_SITE_OTHER): Payer: BC Managed Care – PPO | Admitting: Emergency Medicine

## 2014-02-24 ENCOUNTER — Ambulatory Visit (HOSPITAL_BASED_OUTPATIENT_CLINIC_OR_DEPARTMENT_OTHER)
Admission: RE | Admit: 2014-02-24 | Discharge: 2014-02-24 | Disposition: A | Discharge status: Home / Self Care | Payer: BC Managed Care – PPO | Source: Ambulatory Visit | Attending: Emergency Medicine | Admitting: Emergency Medicine

## 2014-02-24 VITALS — BP 128/74 | HR 86 | Temp 98.5°F | Resp 16 | Ht 63.5 in | Wt 142.0 lb

## 2014-02-24 DIAGNOSIS — M79605 Pain in left leg: Secondary | ICD-10-CM | POA: Insufficient documentation

## 2014-02-24 DIAGNOSIS — S90121A Contusion of right lesser toe(s) without damage to nail, initial encounter: Secondary | ICD-10-CM

## 2014-02-24 DIAGNOSIS — S8012XA Contusion of left lower leg, initial encounter: Secondary | ICD-10-CM

## 2014-02-24 DIAGNOSIS — W19XXXA Unspecified fall, initial encounter: Secondary | ICD-10-CM | POA: Diagnosis not present

## 2014-02-24 DIAGNOSIS — M7989 Other specified soft tissue disorders: Secondary | ICD-10-CM | POA: Diagnosis not present

## 2014-02-24 DIAGNOSIS — Z23 Encounter for immunization: Secondary | ICD-10-CM

## 2014-02-24 MED ORDER — MUPIROCIN 2 % EX OINT: TOPICAL_OINTMENT | CUTANEOUS | Status: DC

## 2014-02-24 NOTE — Addendum Note (Signed)
Addended by: Arlyss Queen A on: 02/24/2014 02:36 PM   Modules accepted: Orders

## 2014-02-24 NOTE — Progress Notes (Signed)
   Subjective:    Patient ID: Desiree Pruitt, female    DOB: 1965-12-08, 49 y.o.   MRN: [id number]  HPI    Review of Systems     Objective:   Physical Exam        Assessment & Plan:

## 2014-02-24 NOTE — Progress Notes (Addendum)
This chart was scribed for Nena Jordan, MD by Einar Pheasant, ED Scribe. This patient was seen in room 8 and the patient's care was started at 11:51 AM.  Subjective:    Patient ID: Desiree Pruitt, female    DOB: 1965/07/15, 49 y.o.   MRN: [id number]  HPI Desiree Pruitt is a 49 y.o. female with no pertinent medical PMhx listed below.  Today, pt presents to the office complaining of a left foot injury that occurred while on a trip in [place]. Pt was trying to cross a river when she tripped and fell. She scraped her left lateral/anterior leg on a rock and scraped her right big toe on another rock. Positive numbness to the lateral side of her left leg. She suffered a break in the skin on the great right toe and she is concerned for a possible infection. There is a mild area of erythema to the affected foot. She states that she just returned on the 2nd. Yesterday, she states that she started feeling the blood "draining" down her leg.  Pt does not recall the date of her last tetanus vaccine. Denies any fever, chills, nausea, emesis, abdominal pain, HA, weakness, possibility of pregnancy, or fatigue.    Patient Active Problem List   Diagnosis Date Noted  . Routine general medical examination at a health care facility 01/09/2013   History reviewed. No pertinent past medical history. Past Surgical History  Procedure Laterality Date  . Ovarian cyst removal Left    Allergies  Allergen Reactions  . Monistat [Miconazole] Rash    burning   Prior to Admission medications   Not on File   History   Social History  . Marital Status: Single    Spouse Name: N/A    Number of Children: N/A  . Years of Education: N/A   Occupational History  . Not on file.   Social History Main Topics  . Smoking status: Never Smoker   . Smokeless tobacco: Never Used  . Alcohol Use: 1.2 oz/week    2 Glasses of wine per week  . Drug Use: No  . Sexual Activity: Yes    Birth Control/ Protection: None    Other Topics Concern  . Not on file   Social History Narrative     Review of Systems  Constitutional: Negative for fatigue and unexpected weight change.  Respiratory: Negative for chest tightness and shortness of breath.   Cardiovascular: Negative for chest pain, palpitations and leg swelling.  Gastrointestinal: Negative for abdominal pain and blood in stool.  Skin: Positive for color change.  Neurological: Negative for dizziness, syncope, light-headedness and headaches.       Objective:   Physical Exam  Constitutional: She appears well-developed and well-nourished. No distress.  HENT:  Head: Normocephalic and atraumatic.  Eyes: Conjunctivae are normal. Right eye exhibits no discharge. Left eye exhibits no discharge.  Neck: Neck supple.  Cardiovascular: Normal rate, regular rhythm and normal heart sounds.  Exam reveals no gallop and no friction rub.   No murmur heard. Pulmonary/Chest: Effort normal and breath sounds normal. No respiratory distress.  Abdominal: Soft. She exhibits no distension. There is no tenderness.  Musculoskeletal: She exhibits no edema or tenderness.  Right great toe: the medial portion of the tissue adjecent to the nail is cracked and slightly red.   Left leg: there is a 8.0 x 4.0 inch area of ecchymosis lateral mid lower leg with some bruising noted around the ankle.  Neurological: She is alert.  Skin: Skin is warm and dry.  Psychiatric: She has a normal mood and affect. Her behavior is normal. Thought content normal.  Nursing note and vitals reviewed.   Filed Vitals:   02/24/14 1144  BP: 128/74  Pulse: 86  Temp: 98.5 F (36.9 C)  TempSrc: Oral  Resp: 16  Height: 5' 3.5" (1.613 m)  Weight: 142 lb (64.411 kg)  SpO2: 99%   [facility] reading (PRIMARY) by  Dr.Bernardino Dowell there is sclerosis of the posterior heel on foot film but the ankle tib-fib and toe do not show any signs of fracture       Assessment & Plan:  We'll treat the toe with Bactroban.  She was advised to get stockings with good support to help with swelling. If she has worsening or develops pain in the back of the leg she is to call and we will schedule Doppler of that leg.I personally performed the services describWe'll ultrasound the left calf.ed in this documentation, which was scribed in my presence. The recorded information has been reviewed and is accurate. Doppler study is negative. We'll treat with a support stocking she had Bactroban sent to use for the abraded area on her right great toe.

## 2014-07-09 ENCOUNTER — Telehealth: Payer: Self-pay

## 2014-07-09 NOTE — Telephone Encounter (Signed)
Talked to Desiree Pruitt. She will take care of this.

## 2014-07-09 NOTE — Telephone Encounter (Signed)
Patient says she is playing phone tag with Dr Tamala Julian. She says Dr Tamala Julian is calling her about a bill (that she does not have) and she is very angry. I do not see any documentation from any prior phone calls.

## 2015-01-12 ENCOUNTER — Ambulatory Visit (INDEPENDENT_AMBULATORY_CARE_PROVIDER_SITE_OTHER): Payer: BC Managed Care – PPO | Admitting: Internal Medicine

## 2015-01-12 VITALS — BP 116/80 | HR 80 | Temp 99.1°F | Resp 16 | Ht 63.5 in | Wt 144.8 lb

## 2015-01-12 DIAGNOSIS — J029 Acute pharyngitis, unspecified: Secondary | ICD-10-CM | POA: Diagnosis not present

## 2015-01-12 LAB — POCT RAPID STREP A (OFFICE): Rapid Strep A Screen: NEGATIVE

## 2015-01-12 MED ORDER — AMOXICILLIN 875 MG PO TABS: 875.0000 mg | ORAL_TABLET | Freq: Two times a day (BID) | ORAL | Status: DC

## 2015-01-12 NOTE — Progress Notes (Addendum)
   Subjective:  This chart was scribed for Desiree Lin, MD by Desiree Pruitt, ED Scribe. This patient was seen in room 1o and the patient's care was started at 2:05 PM.   Patient ID: Desiree Pruitt, female    DOB: [birth year], 49 y.o.   MRN: [id number]  HPI   Chief Complaint  Patient presents with  . Sore Throat    x 2 days    HPI Comments: Desiree Pruitt is a 49 y.o. female who presents to the [facility] complaining of left sided sore throat that began 2 days ago. She reports associated swollen lymph nodes and some neck stiffness. She has hx of strep throat that she states started with similar symptoms. 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 No cough and nasal congestion.  History reviewed. No pertinent past medical history. Allergies  Allergen Reactions  . Monistat [Miconazole] Rash    burning   Prior to Admission medications   Medication Sig Start Date End Date Taking? Authorizing Provider  mupirocin ointment (BACTROBAN) 2 % Apply a Pruitt amount of cream to the abraded area on your toe twice a day Patient not taking: Reported on 01/12/2015 02/24/14   Darlyne Russian, MD   Review of Systems  Constitutional: Negative for fever and chills.  HENT: Negative for congestion.   Respiratory: Negative for cough.   Hematological: Positive for adenopathy.       Objective:   Physical Exam  Constitutional: She is oriented to person, place, and time. She appears well-developed and well-nourished. No distress.  HENT:  Head: Normocephalic and atraumatic.  Mouth/Throat: Oropharyngeal exudate and posterior oropharyngeal erythema present.  Eyes: Conjunctivae and EOM are normal.  Neck: Neck supple.  Cardiovascular: Normal rate.   Pulmonary/Chest: Effort normal.  Musculoskeletal: Normal range of motion.  Lymphadenopathy:  Tender 2+ AC nodes.  Neurological: She is alert and oriented to person, place, and time.  Skin: Skin is warm and dry.    Psychiatric: She has a normal mood and affect. Her behavior is normal.  Nursing note and vitals reviewed.  Filed Vitals:   01/12/15 1341  BP: 116/80  Pulse: 80  Temp: 99.1 F (37.3 C)  TempSrc: Oral  Resp: 16  Height: 5' 3.5" (1.613 m)  Weight: 144 lb 12.8 oz (65.681 kg)  SpO2: 98%   Results for orders placed or performed in visit on 01/12/15  POCT rapid strep A  Result Value Ref Range   Rapid Strep A Screen Negative Negative   Assessment & Plan:   1. Acute pharyngitis, unspecified etiology     Orders Placed This Encounter  Procedures  . Culture, Group A Strep  . POCT rapid strep A   Meds ordered this encounter  Medications  . amoxicillin (AMOXIL) 875 MG tablet    Sig: Take 1 tablet (875 mg total) by mouth 2 (two) times daily.    Dispense:  20 tablet    Refill:  0      By signing my name below, I, Desiree Pruitt, attest that this documentation has been prepared under the direction and in the presence of Desiree Lin, MD.  Electronically Signed: Thea Pruitt, ED Scribe. 01/12/2015. 2:21 PM.  I have completed the patient encounter in its entirety as documented by the scribe, with editing by me where necessary. Desiree Pruitt P. Laney Pastor, M.D.    Add Str cult neg

## 2015-01-13 LAB — CULTURE, GROUP A STREP: Organism ID, Bacteria: NORMAL

## 2015-01-17 ENCOUNTER — Telehealth: Payer: Self-pay | Admitting: *Deleted

## 2015-01-17 ENCOUNTER — Telehealth: Payer: Self-pay

## 2015-01-17 NOTE — Telephone Encounter (Signed)
Advised pt that culture was negative and that she could continue her antibiotic.  She still want to know what you had to say about this result of what caused her sore throat.

## 2015-01-17 NOTE — Telephone Encounter (Signed)
Pt called about Strep cultures connected with clinical TL for status update.

## 2015-01-18 NOTE — Telephone Encounter (Signed)
Coxsackie virus likely, so she could stop antibiotic at this point

## 2015-01-21 NOTE — Telephone Encounter (Signed)
Left message for pt to call back  °

## 2015-02-22 ENCOUNTER — Ambulatory Visit (INDEPENDENT_AMBULATORY_CARE_PROVIDER_SITE_OTHER): Payer: BC Managed Care – PPO | Admitting: Family Medicine

## 2015-02-22 VITALS — BP 118/72 | HR 89 | Temp 98.9°F | Resp 18 | Ht 65.0 in | Wt 146.0 lb

## 2015-02-22 DIAGNOSIS — R591 Generalized enlarged lymph nodes: Secondary | ICD-10-CM

## 2015-02-22 DIAGNOSIS — M25579 Pain in unspecified ankle and joints of unspecified foot: Secondary | ICD-10-CM | POA: Diagnosis not present

## 2015-02-22 DIAGNOSIS — R5081 Fever presenting with conditions classified elsewhere: Secondary | ICD-10-CM

## 2015-02-22 DIAGNOSIS — B349 Viral infection, unspecified: Secondary | ICD-10-CM

## 2015-02-22 DIAGNOSIS — R599 Enlarged lymph nodes, unspecified: Secondary | ICD-10-CM

## 2015-02-22 LAB — POCT CBC
Granulocyte percent: 77.4 %G (ref 37–80)
HCT, POC: 41 % (ref 37.7–47.9)
Hemoglobin: 14 g/dL (ref 12.2–16.2)
Lymph, poc: 1 (ref 0.6–3.4)
MCH, POC: 29.4 pg (ref 27–31.2)
MCHC: 34.2 g/dL (ref 31.8–35.4)
MCV: 86 fL (ref 80–97)
MID (cbc): 0.4 (ref 0–0.9)
MPV: 7.5 fL (ref 0–99.8)
POC Granulocyte: 5 (ref 2–6.9)
POC LYMPH PERCENT: 16.3 %L (ref 10–50)
POC MID %: 6.3 %M (ref 0–12)
Platelet Count, POC: 249 10*3/uL (ref 142–424)
RBC: 4.77 M/uL (ref 4.04–5.48)
RDW, POC: 12.9 %
WBC: 6.4 10*3/uL (ref 4.6–10.2)

## 2015-02-22 LAB — COMPLETE METABOLIC PANEL WITH GFR
ALT: 12 U/L (ref 6–29)
AST: 14 U/L (ref 10–35)
Albumin: 4.3 g/dL (ref 3.6–5.1)
Alkaline Phosphatase: 50 U/L (ref 33–115)
BUN: 9 mg/dL (ref 7–25)
CO2: 28 mmol/L (ref 20–31)
Calcium: 9.3 mg/dL (ref 8.6–10.2)
Chloride: 100 mmol/L (ref 98–110)
Creat: 0.88 mg/dL (ref 0.50–1.10)
GFR, Est African American: >89 mL/min (ref >=60)
GFR, Est Non African American: 78 mL/min (ref >=60)
Glucose, Bld: 106 mg/dL — ABNORMAL HIGH (ref 65–99)
Potassium: 3.9 mmol/L (ref 3.5–5.3)
Sodium: 137 mmol/L (ref 135–146)
Total Bilirubin: 0.9 mg/dL (ref 0.2–1.2)
Total Protein: 7.6 g/dL (ref 6.1–8.1)

## 2015-02-22 LAB — POCT RAPID STREP A (OFFICE): Rapid Strep A Screen: NEGATIVE

## 2015-02-22 LAB — POCT INFLUENZA A/B
Influenza A, POC: NEGATIVE
Influenza B, POC: NEGATIVE

## 2015-02-22 NOTE — Progress Notes (Signed)
Chief Complaint:  Chief Complaint  Patient presents with  . Fever    since yesterday     HPI: Desiree Pruitt is a 50 y.o. female who reports to [facility] today complaining of fever and chills and and joint pain, diffuse since yesterday.  She was in [place] recently over 10 days during [place], she was bitten by mosquitoes, fevers and chills and jt pain, no nause vomiting dairrhea, no rashes, no urianry sxs, no blood in stool or urine   Has not had flu vaccine this year.  Partner is also sick with similar sxs. No family hx of inflammatory bowel disease or any inflammatory arthropathies or autoimmune disorders  History reviewed. No pertinent past medical history. Past Surgical History  Procedure Laterality Date  . Ovarian cyst removal Left    Social History   Social History  . Marital Status: Single    Spouse Name: N/A  . Number of Children: N/A  . Years of Education: N/A   Social History Main Topics  . Smoking status: Never Smoker   . Smokeless tobacco: Never Used  . Alcohol Use: 1.2 oz/week    2 Glasses of wine per week  . Drug Use: No  . Sexual Activity: Yes    Birth Control/ Protection: None   Other Topics Concern  . None   Social History Narrative   Family History  Problem Relation Age of Onset  . Cancer Mother     breast  . Hypertension Father   . Heart disease Father   . Cancer Maternal Grandmother     breast   Allergies  Allergen Reactions  . Monistat [Miconazole] Rash    burning   Prior to Admission medications   Medication Sig Start Date End Date Taking? Authorizing Provider  amoxicillin (AMOXIL) 875 MG tablet Take 1 tablet (875 mg total) by mouth 2 (two) times daily. Patient not taking: Reported on 02/22/2015 01/12/15   Leandrew Koyanagi, MD  mupirocin ointment [facility]) 2 % Apply a small amount of cream to the abraded area on your toe twice a day Patient not taking: Reported on 01/12/2015 02/24/14   Darlyne Russian, MD     ROS: The patient  denies chills, night sweats, unintentional weight loss, chest pain, palpitations, wheezing, dyspnea on exertion, nausea, vomiting, abdominal pain, dysuria, hematuria, melena, numbness, weakness, or tingling.   All other systems have been reviewed and were otherwise negative with the exception of those mentioned in the HPI and as above.    PHYSICAL EXAM: Filed Vitals:   02/22/15 1142  BP: 118/72  Pulse: 89  Temp: 98.9 F (37.2 C)  Resp: 18   Body mass index is 24.3 kg/(m^2).   General: Alert, no acute distress HEENT:  Normocephalic, atraumatic, oropharynx patent. EOMI, PERRLA, TM normal  No enlarged thyroid Cardiovascular:  Regular rate and rhythm, no rubs murmurs or gallops.  No Carotid bruits, radial pulse intact. No pedal edema.  Respiratory: Clear to auscultation bilaterally.  No wheezes, rales, or rhonchi.  No cyanosis, no use of accessory musculature Abdominal: No organomegaly, abdomen is soft and non-tender, positive bowel sounds. No masses. Skin: No rashes. Neurologic: Facial musculature symmetric. Psychiatric: Patient acts appropriately throughout our interaction. Lymphatic: No cervical or submandibular lymphadenopathy Musculoskeletal: Gait intact. No edema, tenderness 5/5 strength, full ROM of neck, neg for nuchal rigidity    LABS: Results for orders placed or performed in visit on 02/22/15  POCT CBC  Result Value Ref Range  WBC 6.4 4.6 - 10.2 K/uL   Lymph, poc 1.0 0.6 - 3.4   POC LYMPH PERCENT 16.3 10 - 50 %L   MID (cbc) 0.4 0 - 0.9   POC MID % 6.3 0 - 12 %M   POC Granulocyte 5.0 2 - 6.9   Granulocyte percent 77.4 37 - 80 %G   RBC 4.77 4.04 - 5.48 M/uL   Hemoglobin 14.0 12.2 - 16.2 g/dL   HCT, POC 41.0 37.7 - 47.9 %   MCV 86.0 80 - 97 fL   MCH, POC 29.4 27 - 31.2 pg   MCHC 34.2 31.8 - 35.4 g/dL   RDW, POC 12.9 %   Platelet Count, POC 249 142 - 424 K/uL   MPV 7.5 0 - 99.8 fL  POCT Influenza A/B  Result Value Ref Range   Influenza A, POC Negative Negative     Influenza B, POC Negative Negative  POCT rapid strep A  Result Value Ref Range   Rapid Strep A Screen Negative Negative     EKG/XRAY:   Primary read interpreted by Dr. Marin Comment at [facility].   ASSESSMENT/PLAN: Encounter Diagnoses  Name Primary?  . Swollen gland Yes  . Fever presenting with conditions classified elsewhere   . Pain in joint, ankle and foot, unspecified laterality    Likely viral illness Labs are reassuring  Will get CMP to make sure all is ok, if no improvement with otc ibuprofen /tylenol and fluid sin 24-48 hrs then will do further workup.   Gross sideeffects, risk and benefits, and alternatives of medications d/w patient. Patient is aware that all medications have potential sideeffects and we are unable to predict every sideeffect or drug-drug interaction that may occur.  Thao Le DO  02/22/2015 2:07 PM

## 2015-02-25 ENCOUNTER — Telehealth: Payer: Self-pay | Admitting: Family Medicine

## 2015-02-25 DIAGNOSIS — N83209 Unspecified ovarian cyst, unspecified side: Secondary | ICD-10-CM | POA: Insufficient documentation

## 2015-02-25 NOTE — Telephone Encounter (Signed)
Labs are normal.

## 2016-01-16 ENCOUNTER — Ambulatory Visit (INDEPENDENT_AMBULATORY_CARE_PROVIDER_SITE_OTHER): Payer: BC Managed Care – PPO | Admitting: Physician Assistant

## 2016-01-16 VITALS — BP 100/66 | HR 81 | Temp 98.3°F | Resp 18 | Ht 65.0 in | Wt 145.0 lb

## 2016-01-16 DIAGNOSIS — G4700 Insomnia, unspecified: Secondary | ICD-10-CM

## 2016-01-16 DIAGNOSIS — F439 Reaction to severe stress, unspecified: Secondary | ICD-10-CM | POA: Diagnosis not present

## 2016-01-16 MED ORDER — CLONAZEPAM 0.5 MG PO TABS: 0.5000 mg | ORAL_TABLET | Freq: Two times a day (BID) | ORAL | 0 refills | Status: DC | PRN

## 2016-01-16 NOTE — Progress Notes (Signed)
Lane  MRN: [id number] DOB: 04/15/1965  Subjective:  Pt presents to clinic for discussion of recent stressful situations and resulting anxiety.  She has been seeing an individual therapist who has recommended medication for her.  She is having stress in all aspects of her life - marriage, work with trying to get tenure, father is sick and having surgery in a week, going home to be with parents who do not approve of her lifestyle.    therapist - [facility] - Cleta Alberts - individual therapy - feels like it is helping  Dad having heart surgery on Monday in [place] - leaving for [place] 12/9 to be with her family - going for 2 weeks --   Hard last 1.5 year with wife - screaming fights - they have [facility] - emotional focus therapy - wife is threatening to leave and then comes back which is very stressful - they are not sleeping in the same room because they are having a hard time communicating - she finds that they get into discussions that are not always helpful right before bed which causes a later bedtime and also stress that disrupts her sleep.  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 Negative for dysphoric mood. The patient is nervous/anxious.     Patient Active Problem List   Diagnosis Date Noted  . Routine general medical examination at a health care facility 01/09/2013    No current outpatient prescriptions on file prior to visit.   No current facility-administered medications on file prior to  visit.     Allergies  Allergen Reactions  . Monistat [Miconazole] Rash    burning    Pt patients past, family and social history were reviewed and updated.   Objective:  BP 100/66 (BP Location: Right Arm, Patient Position: Sitting, Cuff Size: Small)   Pulse 81   Temp 98.3 F (36.8 C) (Oral)   Resp 18   Ht 5\' 5"  (1.651 m)   Wt 145 lb (65.8 kg)   LMP 01/02/2016   SpO2 98%   BMI 24.13 kg/m   Physical Exam  Constitutional: She is oriented to person, place, and time and well-developed, well-nourished, and in no distress.  HENT:  Head: Normocephalic and atraumatic.  Right Ear: Hearing and external ear normal.  Left Ear: Hearing and external ear normal.  Eyes: Conjunctivae are normal.  Neck: Normal range of motion.  Pulmonary/Chest: Effort normal.  Neurological: She is alert and oriented to person, place, and time. Gait normal.  Skin: Skin is warm and dry.  Psychiatric: Mood, memory, affect and judgment normal.  Vitals reviewed.  Spent 30 mins with pt with >50% in counseling Assessment and Plan :  Stress at home - Plan: clonazePAM (KLONOPIN) 0.5 MG tablet  Insomnia, unspecified type - Plan: clonazePAM (KLONOPIN) 0.5 MG tablet   This is a short term situation stressor with adjustment disorder.  Pt has stress in every aspect of her  life  - at this time long term medications do not make sense as we will treat with short acting medications to allow her to get through the situation as she expects this to be improved in the next month or so - sleep is a problem for her so hopefully as we get to sleep to occur she will be improved during the day - she will continue therapy and work on no discussions late at night as this is a problems with sleep disruption - we discussed the nature of the medications that I Rx for her and she understands the risks associated with them.  She will start with a low dose to see how they affect her and she will f/u with my through mychart if she has  questions - otherwise I will see her in a month when she returns from [place].    Windell Hummingbird PA-C  [facility] 01/18/2016 7:39 AM

## 2016-01-16 NOTE — Patient Instructions (Signed)
     IF you received an x-ray today, you will receive an invoice from  Radiology. Please contact  Radiology at [phone] with questions or concerns regarding your invoice.   IF you received labwork today, you will receive an invoice from [facility]. Please contact [facility] at [phone] with questions or concerns regarding your invoice.   Our billing staff will not be able to assist you with questions regarding bills from these companies.  You will be contacted with the lab results as soon as they are available. The fastest way to get your results is to activate your My Chart account. Instructions are located on the last page of this paperwork. If you have not heard from us regarding the results in 2 weeks, please contact this office.      

## 2016-01-17 ENCOUNTER — Encounter: Payer: Self-pay | Admitting: Physician Assistant

## 2016-06-24 ENCOUNTER — Ambulatory Visit: Payer: BC Managed Care – PPO | Admitting: Emergency Medicine

## 2016-07-10 ENCOUNTER — Encounter: Payer: Self-pay | Admitting: Obstetrics and Gynecology

## 2016-07-10 ENCOUNTER — Other Ambulatory Visit (HOSPITAL_COMMUNITY)
Admission: RE | Admit: 2016-07-10 | Discharge: 2016-07-10 | Disposition: A | Discharge status: Home / Self Care | Payer: BC Managed Care – PPO | Source: Ambulatory Visit | Attending: Obstetrics and Gynecology | Admitting: Obstetrics and Gynecology

## 2016-07-10 ENCOUNTER — Ambulatory Visit (INDEPENDENT_AMBULATORY_CARE_PROVIDER_SITE_OTHER): Payer: BC Managed Care – PPO | Admitting: Obstetrics and Gynecology

## 2016-07-10 VITALS — BP 102/70 | HR 68 | Resp 16 | Ht 64.0 in | Wt 144.0 lb

## 2016-07-10 DIAGNOSIS — Z01419 Encounter for gynecological examination (general) (routine) without abnormal findings: Secondary | ICD-10-CM

## 2016-07-10 DIAGNOSIS — Z803 Family history of malignant neoplasm of breast: Secondary | ICD-10-CM | POA: Diagnosis not present

## 2016-07-10 DIAGNOSIS — Z1211 Encounter for screening for malignant neoplasm of colon: Secondary | ICD-10-CM

## 2016-07-10 DIAGNOSIS — R1030 Lower abdominal pain, unspecified: Secondary | ICD-10-CM

## 2016-07-10 LAB — POCT URINALYSIS DIPSTICK
Bilirubin, UA: NEGATIVE
Blood, UA: NEGATIVE
Glucose, UA: NEGATIVE
Ketones, UA: NEGATIVE
Leukocytes, UA: NEGATIVE
Nitrite, UA: NEGATIVE
Protein, UA: NEGATIVE
Urobilinogen, UA: 0.2 E.U./dL
pH, UA: 7 (ref 5.0–8.0)

## 2016-07-10 NOTE — Progress Notes (Signed)
51 y.o. G0P0000 Single Caucasian female here as a new patient for an annual exam. Patient also complains of lower abdominal burning pain that extends to the anus. Patient was referred by a friend.   Patient has this pain infrequently.  Once every 4 - 6 months. It is a burning pain below her Pfannenstiel incision that radiates to the anal region.  It lingers down to the rectum before it goes away. Lasts about 30 minutes. It is better now.  Had a BM this am and then pain occurred some time later. No diarrhea.  No dysuria.  Did see GI but did not see anyone for it. Had H Pylori exam which was negative. Wonders if the has lactose or gluten intolerance.  Menses are regular. Not particularly painful.  Mother dx with breast cancer in early 58s.  She had recurrences and bilateral involvement.  GM was 65 with her dx of breast cancer.  Wants labs but not sure what her insurance will cover.  Going to [place] to settle her father's final estate.  Urine dip:  Negative.  PCP: Windell Hummingbird, PA  Patient's last menstrual period was 06/16/2016 (within weeks).     Period Cycle (Days): 28 Period Duration (Days): 3 Period Pattern: Regular Menstrual Flow: Moderate Menstrual Control: Tampon, Maxi pad Menstrual Control Change Freq (Hours): 4-5 Dysmenorrhea: (!) Mild Dysmenorrhea Symptoms: Cramping (occasional cramping)     Sexually active: Yes.    The current method of family planning is none and female partner.    Exercising: Yes.    gym twice a week Smoker:  no  Health Maintenance: Pap:  Patient unsure if pap was done with AEX 2017 at [place] of abnormal Pap:  no MMG:  2017 normal per patient -- done with Charlesetta Ivory -- scheduled today with [facility] Colonoscopy:  Not done BMD:   n/a  Result  n/a TDaP:  2016 Gardasil:   n/a HIV: 2015 per patient negative Hep C: 2015 per patient negative Screening Labs:  Hb today: discuss today, Urine today: urine sample given.     reports that she has never smoked. She has never used smokeless tobacco. She reports that she drinks about 1.2 oz of alcohol per week . She reports that she does not use drugs.  History reviewed. No pertinent past medical history.  Past Surgical History:  Procedure Laterality Date  . breast duct biopsy Left   . OVARIAN CYST REMOVAL Left     No current outpatient prescriptions on file.   No current facility-administered medications for this visit.     Family History  Problem Relation Age of Onset  . Cancer Mother        breast  . Hypertension Father   . Heart disease Father   . Cancer Maternal Grandmother        breast    ROS:  Pertinent items are noted in HPI.  Otherwise, a comprehensive ROS was negative.  Exam:   BP 102/70 (BP Location: Right Arm, Patient Position: Sitting, Cuff Size: Normal)   Pulse 68   Resp 16   Ht 5\' 4"  (1.626 m)   Wt 144 lb (65.3 kg)   LMP 06/16/2016 (Within Weeks)   BMI 24.72 kg/m     General appearance: alert, cooperative and appears stated age Head: Normocephalic, without obvious abnormality, atraumatic Neck: no adenopathy, supple, symmetrical, trachea midline and thyroid normal to inspection and palpation Lungs: clear to auscultation bilaterally Breasts: normal appearance, no masses or tenderness, No nipple  retraction or dimpling, No nipple discharge or bleeding, No axillary or supraclavicular adenopathy Heart: regular rate and rhythm Abdomen: Pfannenstiel incision, soft, non-tender; no masses, no organomegaly Extremities: extremities normal, atraumatic, no cyanosis or edema Skin: Skin color, texture, turgor normal. No rashes or lesions Lymph nodes: Cervical, supraclavicular, and axillary nodes normal. No abnormal inguinal nodes palpated Neurologic: Grossly normal  Pelvic: External genitalia:  no lesions              Urethra:  normal appearing urethra with no masses, tenderness or lesions              Bartholins and Skenes: normal                  Vagina: normal appearing vagina with normal color and discharge, no lesions              Cervix: no lesions              Pap taken: Yes.   Bimanual Exam:  Uterus:  normal size, contour, position, consistency, mobility, non-tender              Adnexa: no mass, fullness, tenderness              Rectal exam: Yes.  .  Confirms.              Anus:  normal sphincter tone, no lesions  Chaperone was present for exam.  Assessment:   Well woman visit with normal exam. FH breast cancer mother and mat GM. Status post left ovarian cyst removal.  Lower abdominal pain.  Unclear etiology.  Plan: Mammogram screening discussed. Recommended self breast awareness. Pap and HR HPV as above. Guidelines for Calcium, Vitamin D, regular exercise program including cardiovascular and weight bearing exercise. Will do fasting labs when she returns for pelvic ultrasound.  Referral to Dr. Collene Mares for colonoscopy and lower abdominal pain. Referral for genetic counseling and testing.  Follow up annually and prn.    After visit summary provided.

## 2016-07-10 NOTE — Patient Instructions (Signed)

## 2016-07-14 ENCOUNTER — Telehealth: Payer: Self-pay | Admitting: *Deleted

## 2016-07-14 ENCOUNTER — Telehealth: Payer: Self-pay | Admitting: Obstetrics and Gynecology

## 2016-07-14 LAB — CYTOLOGY - PAP
Diagnosis: NEGATIVE
HPV: NOT DETECTED

## 2016-07-14 NOTE — Telephone Encounter (Signed)
Spoke with patient regarding benefit for an ultrasound. Patient understood and agreeable. Patient ready to schedule. Patient scheduled 07/16/16 with Dr Quincy Simmonds. Patient aware of date, arrival time and cancellation policy. Confirmed with patient she can have her fasting lab draw at the time of this appointment. Patient had no further questions.   Routing to Dr Quincy Simmonds

## 2016-07-14 NOTE — Telephone Encounter (Signed)
Thank you for the update.  Encounter closed. 

## 2016-07-14 NOTE — Telephone Encounter (Signed)
Opened in error, will close encounter

## 2016-07-16 ENCOUNTER — Ambulatory Visit (INDEPENDENT_AMBULATORY_CARE_PROVIDER_SITE_OTHER): Payer: BC Managed Care – PPO | Admitting: Obstetrics and Gynecology

## 2016-07-16 ENCOUNTER — Encounter: Payer: Self-pay | Admitting: Obstetrics and Gynecology

## 2016-07-16 ENCOUNTER — Ambulatory Visit (INDEPENDENT_AMBULATORY_CARE_PROVIDER_SITE_OTHER): Payer: BC Managed Care – PPO

## 2016-07-16 VITALS — BP 110/66 | HR 70 | Ht 64.0 in | Wt 144.0 lb

## 2016-07-16 DIAGNOSIS — Z0000 Encounter for general adult medical examination without abnormal findings: Secondary | ICD-10-CM | POA: Diagnosis not present

## 2016-07-16 DIAGNOSIS — D259 Leiomyoma of uterus, unspecified: Secondary | ICD-10-CM | POA: Diagnosis not present

## 2016-07-16 DIAGNOSIS — Z0189 Encounter for other specified special examinations: Secondary | ICD-10-CM

## 2016-07-16 DIAGNOSIS — R1030 Lower abdominal pain, unspecified: Secondary | ICD-10-CM

## 2016-07-16 DIAGNOSIS — R102 Pelvic and perineal pain: Secondary | ICD-10-CM

## 2016-07-16 DIAGNOSIS — Z01419 Encounter for gynecological examination (general) (routine) without abnormal findings: Secondary | ICD-10-CM

## 2016-07-16 LAB — CBC
HCT: 39.7 % (ref 35.0–45.0)
Hemoglobin: 12.9 g/dL (ref 11.7–15.5)
MCH: 28.2 pg (ref 27.0–33.0)
MCHC: 32.5 g/dL (ref 32.0–36.0)
MCV: 86.7 fL (ref 80.0–100.0)
MPV: 10.3 fL (ref 7.5–12.5)
Platelets: 284 10*3/uL (ref 140–400)
RBC: 4.58 MIL/uL (ref 3.80–5.10)
RDW: 13.5 % (ref 11.0–15.0)
WBC: 5.8 10*3/uL (ref 3.8–10.8)

## 2016-07-16 LAB — COMPREHENSIVE METABOLIC PANEL
ALT: 10 U/L (ref 6–29)
AST: 14 U/L (ref 10–35)
Albumin: 4.2 g/dL (ref 3.6–5.1)
Alkaline Phosphatase: 40 U/L (ref 33–115)
BUN: 12 mg/dL (ref 7–25)
CO2: 24 mmol/L (ref 20–31)
Calcium: 9.3 mg/dL (ref 8.6–10.2)
Chloride: 104 mmol/L (ref 98–110)
Creat: 0.9 mg/dL (ref 0.50–1.10)
Glucose, Bld: 83 mg/dL (ref 65–99)
Potassium: 4.9 mmol/L (ref 3.5–5.3)
Sodium: 139 mmol/L (ref 135–146)
Total Bilirubin: 0.7 mg/dL (ref 0.2–1.2)
Total Protein: 7.2 g/dL (ref 6.1–8.1)

## 2016-07-16 LAB — LIPID PANEL
Cholesterol: 200 mg/dL — ABNORMAL HIGH (ref <200)
HDL: 84 mg/dL (ref >50)
LDL Cholesterol: 103 mg/dL — ABNORMAL HIGH (ref <100)
Total CHOL/HDL Ratio: 2.4 Ratio (ref <5.0)
Triglycerides: 66 mg/dL (ref <150)
VLDL: 13 mg/dL (ref <30)

## 2016-07-16 NOTE — Patient Instructions (Signed)
Uterine Fibroids Uterine fibroids are tissue masses (tumors). They are also called leiomyomas. They can develop inside of a woman's womb (uterus). They can grow very large. Fibroids are not cancerous (benign). Most fibroids do not require medical treatment. Follow these instructions at home:  Keep all follow-up visits as told by your doctor. This is important.  Take medicines only as told by your doctor. ? If you were prescribed a hormone treatment, take the hormone medicines exactly as told. ? Do not take aspirin. It can cause bleeding.  Ask your doctor about taking iron pills and increasing the amount of dark green, leafy vegetables in your diet. These actions can help to boost your blood iron levels.  Pay close attention to your period. Tell your doctor about any changes, such as: ? Increased blood flow. This may require you to use more pads or tampons than usual per month. ? A change in the number of days that your period lasts per month. ? A change in symptoms that come with your period, such as back pain or cramping in your belly area (abdomen). Contact a doctor if:  You have pain in your back or the area between your hip bones (pelvic area) that is not controlled by medicines.  You have pain in your abdomen that is not controlled with medicines.  You have an increase in bleeding between and during periods.  You soak tampons or pads in a half hour or less.  You feel lightheaded.  You feel extra tired.  You feel weak. Get help right away if:  You pass out (faint).  You have a sudden increase in pelvic pain. This information is not intended to replace advice given to you by your health care provider. Make sure you discuss any questions you have with your health care provider. Document Released: 03/07/2010 Document Revised: 10/04/2015 Document Reviewed: 08/01/2013 Elsevier Interactive Patient Education  2018 [employer]  

## 2016-07-16 NOTE — Progress Notes (Signed)
110/66Patient ID: Desiree Pruitt, female   DOB: [birth year], 51 y.o.   MRN: [id number] GYNECOLOGY  VISIT   HPI: 51 y.o.   Single  Caucasian  female   G0P0000 with Patient's last menstrual period was 07/13/2016 (approximate).   here for pelvic ultrasound for lower abdominal pain of unclear etiology.  Has abdominal pain that goes to her anus.  Has referral to Dr. Collene Mares for colonoscopy and evaluation of abdominal/anal pain.   Got her menses just after she had her visit here and had her usual pain.  Bleeding off and on for one week.  Wants labs today - only lipid profile, CMP and CBC. Is fasting.  Is concerned about vit D level check.   GYNECOLOGIC HISTORY: Patient's last menstrual period was 07/13/2016 (approximate). Contraception: None--female partner Menopausal hormone therapy:  none Last mammogram: 07-10-16 3D/Density C/possible architectural distortion in Rt.Br.;Lt.Br.Neg. DX MMG done 07-16-16 and is pending:[facility] Last pap smear:  07-10-16 Neg:Neg HR HPV        OB History    Gravida Para Term Preterm AB Living   0 0 0 0 0 0   SAB TAB Ectopic Multiple Live Births   0 0 0 0 0         Patient Active Problem List   Diagnosis Date Noted  . Routine general medical examination at a health care facility 01/09/2013    No past medical history on file.  Past Surgical History:  Procedure Laterality Date  . breast duct biopsy Left   . OVARIAN CYST REMOVAL Left     No current outpatient prescriptions on file.   No current facility-administered medications for this visit.      ALLERGIES: Monistat [miconazole]  Family History  Problem Relation Age of Onset  . Cancer Mother        breast  . Hypertension Father   . Heart disease Father   . Cancer Maternal Grandmother        breast    Social History   Social History  . Marital status: Single    Spouse name: N/A  . Number of children: N/A  . Years of education: N/A   Occupational History  . professor    Social  History Main Topics  . Smoking status: Never Smoker  . Smokeless tobacco: Never Used  . Alcohol use 1.2 oz/week    2 Glasses of wine per week  . Drug use: No  . Sexual activity: Yes    Birth control/ protection: None   Other Topics Concern  . Not on file   Social History Narrative   Married - wife from [place]   Professor - Dance history and theory     ROS:  Pertinent items are noted in HPI.  PHYSICAL EXAMINATION:    BP 110/66 (BP Location: Right Arm, Patient Position: Sitting, Cuff Size: Normal)   Pulse 70   Ht 5\' 4"  (1.626 m)   Wt 144 lb (65.3 kg)   LMP 07/13/2016 (Approximate)   BMI 24.72 kg/m     General appearance: alert, cooperative and appears stated age   Pelvic ultrasound: Uterus with 4 small intramural fibroids, largest 11 mm, one encroaching on endometrial cavity. EMS 4.36 mm.  15 mm paraovarian versus paratubal cyst.  Normal ovaries.  No free fluid.  Chaperone was present for exam.  ASSESSMENT  Pelvic pain/rectal pain.  Dysmenorrhea?  Endometriosis.  Uterine fibroids.   PLAN  Discussion of fibroids and pelvic pain.  Patient will monitor and call for  bleeding abnormalities or increasing intensity or frequency of pain.  Motrin prn.  Follow up with Dr. Collene Mares for colonoscopy.  Fasting labs.    An After Visit Summary was printed and given to the patient.  __15____ minutes face to face time of which over 50% was spent in counseling.

## 2016-07-16 NOTE — Progress Notes (Signed)
Encounter reviewed by Dr. Brook Amundson C. Silva.  

## 2016-07-29 ENCOUNTER — Telehealth: Payer: Self-pay | Admitting: Obstetrics and Gynecology

## 2016-07-29 NOTE — Telephone Encounter (Signed)
Call to patient to notify attempting to schedule with Genetics division of [facility].   Patient may call [phone] to schedule with a genetic counselor.

## 2016-08-07 ENCOUNTER — Encounter: Payer: Self-pay | Admitting: Obstetrics and Gynecology

## 2016-12-09 ENCOUNTER — Encounter: Payer: Self-pay | Admitting: Physician Assistant

## 2016-12-09 ENCOUNTER — Ambulatory Visit (INDEPENDENT_AMBULATORY_CARE_PROVIDER_SITE_OTHER): Payer: BC Managed Care – PPO | Admitting: Physician Assistant

## 2016-12-09 VITALS — BP 118/84 | HR 77 | Temp 98.5°F | Resp 16 | Ht 64.0 in | Wt 150.6 lb

## 2016-12-09 DIAGNOSIS — L989 Disorder of the skin and subcutaneous tissue, unspecified: Secondary | ICD-10-CM | POA: Diagnosis not present

## 2016-12-09 NOTE — Progress Notes (Signed)
PRIMARY CARE AT [facility] [address], [place] [address] [phone]  Date:  12/09/2016   Name:  Desiree Pruitt   DOB:  06-Mar-1965   MRN:  [id number]  PCP:  Patient, No Pcp Per    History of Present Illness:  Desiree Pruitt is a 51 y.o. female patient who presents to PCP with  Chief Complaint  Patient presents with  . skin issue    has new mole on right upper chest area     She has right upper chest mole that has grown over the last 4 years.  She was aware of this from a photo she recently saw.   No pain.  She has noticed some bleeding of the area once.   No hx of cancerous/pre-cancerous lesions.   No family hx of skin cancers. Not a great deal of sun exposure.  Patient Active Problem List   Diagnosis Date Noted  . Routine general medical examination at a health care facility 01/09/2013    No past medical history on file.  Past Surgical History:  Procedure Laterality Date  . breast duct biopsy Left   . OVARIAN CYST REMOVAL Left     Social History  Substance Use Topics  . Smoking status: Never Smoker  . Smokeless tobacco: Never Used  . Alcohol use 1.2 oz/week    2 Glasses of wine per week    Family History  Problem Relation Age of Onset  . Cancer Mother        breast  . Hypertension Father   . Heart disease Father   . Cancer Maternal Grandmother        breast    Allergies  Allergen Reactions  . Monistat [Miconazole] Rash    burning    Medication list has been reviewed and updated.  No current outpatient prescriptions on file prior to visit.   No current facility-administered medications on file prior to visit.     ROS ROS otherwise unremarkable unless listed above.  Physical Examination: BP 118/84   Pulse 77   Temp 98.5 F (36.9 C) (Oral)   Resp 16   Ht 5\' 4"  (1.626 m)   Wt 150 lb 9.6 oz (68.3 kg)   SpO2 99%   BMI 25.85 kg/m  Ideal Body Weight: Weight in (lb) to have BMI = 25: 145.3  Physical Exam  Constitutional: She is  oriented to person, place, and time. She appears well-developed and well-nourished. No distress.  HENT:  Head: Normocephalic and atraumatic.  Right Ear: External ear normal.  Left Ear: External ear normal.  Eyes: Pupils are equal, round, and reactive to light. Conjunctivae and EOM are normal.  Cardiovascular: Normal rate.   Pulmonary/Chest: Effort normal. No respiratory distress.  Neurological: She is alert and oriented to person, place, and time.  Skin: She is not diaphoretic.  Anterior chest wall with upper left raised. stuck-on appearing lesion that is annular in form about 1 cm in diameter.  There is no erythema, drainage, or multiple color.   Psychiatric: She has a normal mood and affect. Her behavior is normal.     Assessment and Plan: Desiree Pruitt is a 51 y.o. female who is here today for cc of  Chief Complaint  Patient presents with  . skin issue    has new mole on right upper chest area  --this appears to be seborrheic keratosis.  She is concerned, and I will send to dermatology for 2nd opinion.  She is also  contemplating of having this excised, which may be more advantageous with specialist.  Skin lesion on examination - Plan: Ambulatory referral to Dermatology, CANCELED: Ambulatory referral to Dermatology  Ivar Drape, PA-C [facility] 10/27/20188:37 AM  There are no diagnoses linked to this encounter.  Ivar Drape, PA-C [facility] 12/09/2016 11:36 AM

## 2016-12-09 NOTE — Patient Instructions (Addendum)
  Seborrheic Keratosis Seborrheic keratosis is a common, noncancerous (benign) skin growth. This condition causes waxy, rough, tan, brown, or black spots to appear on the skin. These skin growths can be flat or raised. What are the causes? The cause of this condition is not known. What increases the risk? This condition is more likely to develop in:  People who have a family history of seborrheic keratosis.  People who are 18 or older.  People who are pregnant.  People who have had estrogen replacement therapy.  What are the signs or symptoms? This condition often occurs on the face, chest, shoulders, back, or other areas. These growths:  Are usually painless, but may become irritated and itchy.  Can be yellow, brown, black, or other colors.  Are slightly raised or have a flat surface.  Are sometimes rough or wart-like in texture.  Are often waxy on the surface.  Are round or oval-shaped.  Sometimes look like they are "stuck on."  Often occur in groups, but may occur as a single growth.  How is this diagnosed? This condition is diagnosed with a medical history and physical exam. A sample of the growth may be tested (skin biopsy). You may need to see a skin specialist (dermatologist). How is this treated? Treatment is not usually needed for this condition, unless the growths are irritated or are often bleeding. You may also choose to have the growths removed if you do not like their appearance. Most commonly, these growths are treated with a procedure in which liquid nitrogen is applied to "freeze" off the growth (cryosurgery). They may also be burned off with electricity or cut off. Follow these instructions at home:  Watch your growth for any changes.  Keep all follow-up visits as told by your health care provider. This is important.  Do not scratch or pick at the growth or growths. This can cause them to become irritated or infected. Contact a health care provider  if:  You suddenly have many new growths.  Your growth bleeds, itches, or hurts.  Your growth suddenly becomes larger or changes color. This information is not intended to replace advice given to you by your health care provider. Make sure you discuss any questions you have with your health care provider. Document Released: 03/07/2010 Document Revised: 07/11/2015 Document Reviewed: 06/20/2014 Elsevier Interactive Patient Education  2017 [employer].    IF you received an x-ray today, you will receive an invoice from [facility]. Please contact [facility] at [phone] with questions or concerns regarding your invoice.   IF you received labwork today, you will receive an invoice from [facility]. Please contact [facility] at [phone] with questions or concerns regarding your invoice.   Our billing staff will not be able to assist you with questions regarding bills from these companies.  You will be contacted with the lab results as soon as they are available. The fastest way to get your results is to activate your My Chart account. Instructions are located on the last page of this paperwork. If you have not heard from [place] regarding the results in 2 weeks, please contact this office.

## 2016-12-12 ENCOUNTER — Telehealth: Payer: Self-pay | Admitting: Physician Assistant

## 2016-12-15 NOTE — Telephone Encounter (Signed)
error 

## 2017-05-26 ENCOUNTER — Encounter: Payer: Self-pay | Admitting: Physician Assistant

## 2017-06-08 ENCOUNTER — Encounter: Payer: Self-pay | Admitting: Obstetrics and Gynecology

## 2017-08-05 ENCOUNTER — Ambulatory Visit: Payer: BC Managed Care – PPO | Admitting: Obstetrics and Gynecology

## 2017-08-05 ENCOUNTER — Other Ambulatory Visit: Payer: Self-pay

## 2017-08-05 ENCOUNTER — Telehealth: Payer: Self-pay | Admitting: Genetic Counselor

## 2017-08-05 ENCOUNTER — Encounter: Payer: Self-pay | Admitting: Obstetrics and Gynecology

## 2017-08-05 VITALS — BP 112/72 | HR 84 | Resp 16 | Ht 63.25 in | Wt 153.0 lb

## 2017-08-05 DIAGNOSIS — Z803 Family history of malignant neoplasm of breast: Secondary | ICD-10-CM

## 2017-08-05 DIAGNOSIS — Z1211 Encounter for screening for malignant neoplasm of colon: Secondary | ICD-10-CM | POA: Diagnosis not present

## 2017-08-05 DIAGNOSIS — Z01419 Encounter for gynecological examination (general) (routine) without abnormal findings: Secondary | ICD-10-CM

## 2017-08-05 NOTE — Patient Instructions (Signed)
EXERCISE AND DIET:  We recommended that you start or continue a regular exercise program for good health. Regular exercise means any activity that makes your heart beat faster and makes you sweat.  We recommend exercising at least 30 minutes per day at least 3 days a week, preferably 4 or 5.  We also recommend a diet low in fat and sugar.  Inactivity, poor dietary choices and obesity can cause diabetes, heart attack, stroke, and kidney damage, among others.    ALCOHOL AND SMOKING:  Women should limit their alcohol intake to no more than 7 drinks/beers/glasses of wine (combined, not each!) per week. Moderation of alcohol intake to this level decreases your risk of breast cancer and liver damage. And of course, no recreational drugs are part of a healthy lifestyle.  And absolutely no smoking or even second hand smoke. Most people know smoking can cause heart and lung diseases, but did you know it also contributes to weakening of your bones? Aging of your skin?  Yellowing of your teeth and nails?  CALCIUM AND VITAMIN D:  Adequate intake of calcium and Vitamin D are recommended.  The recommendations for exact amounts of these supplements seem to change often, but generally speaking 600 mg of calcium (either carbonate or citrate) and 800 units of Vitamin D per day seems prudent. Certain women may benefit from higher intake of Vitamin D.  If you are among these women, your doctor will have told you during your visit.    PAP SMEARS:  Pap smears, to check for cervical cancer or precancers,  have traditionally been done yearly, although recent scientific advances have shown that most women can have pap smears less often.  However, every woman still should have a physical exam from her gynecologist every year. It will include a breast check, inspection of the vulva and vagina to check for abnormal growths or skin changes, a visual exam of the cervix, and then an exam to evaluate the size and shape of the uterus and  ovaries.  And after 51 years of age, a rectal exam is indicated to check for rectal cancers. We will also provide age appropriate advice regarding health maintenance, like when you should have certain vaccines, screening for sexually transmitted diseases, bone density testing, colonoscopy, mammograms, etc.   MAMMOGRAMS:  All women over 40 years old should have a yearly mammogram. Many facilities now offer a "3D" mammogram, which may cost around $50 extra out of pocket. If possible,  we recommend you accept the option to have the 3D mammogram performed.  It both reduces the number of women who will be called back for extra views which then turn out to be normal, and it is better than the routine mammogram at detecting truly abnormal areas.    COLONOSCOPY:  Colonoscopy to screen for colon cancer is recommended for all women at age 50.  We know, you hate the idea of the prep.  We agree, BUT, having colon cancer and not knowing it is worse!!  Colon cancer so often starts as a polyp that can be seen and removed at colonscopy, which can quite literally save your life!  And if your first colonoscopy is normal and you have no family history of colon cancer, most women don't have to have it again for 10 years.  Once every ten years, you can do something that may end up saving your life, right?  We will be happy to help you get it scheduled when you are ready.    Be sure to check your insurance coverage so you understand how much it will cost.  It may be covered as a preventative service at no cost, but you should check your particular policy.     Menopause and Herbal Products What is menopause? Menopause is the normal time of life when menstrual periods decrease in frequency and eventually stop completely. This process can take several years for some women. Menopause is complete when you have had an absence of menstruation for a full year since your last menstrual period. It usually occurs between the ages of 48 and  55. It is not common for menopause to begin before the age of 40. During menopause, your body stops producing the female hormones estrogen and progesterone. Common symptoms associated with this loss of hormones (vasomotor symptoms) are:  Hot flashes.  Hot flushes.  Night sweats.  Other common symptoms and complications of menopause include:  Decrease in sex drive.  Vaginal dryness and thinning of the walls of the vagina. This can make sex painful.  Dryness of the skin and development of wrinkles.  Headaches.  Tiredness.  Irritability.  Memory problems.  Weight gain.  Bladder infections.  Hair growth on the face and chest.  Inability to reproduce offspring (infertility).  Loss of density in the bones (osteoporosis) increasing your risk for breaks (fractures).  Depression.  Hardening and narrowing of the arteries (atherosclerosis). This increases your risk of heart attack and stroke.  What treatment options are available? There are many treatment choices for menopause symptoms. The most common treatment is hormone replacement therapy. Many alternative therapies for menopause are emerging, including the use of herbal products. These supplements can be found in the form of herbs, teas, oils, tinctures, and pills. Common herbal supplements for menopause are made from plants that contain phytoestrogens. Phytoestrogens are compounds that occur naturally in plants and plant products. They act like estrogen in the body. Foods and herbs that contain phytoestrogens include:  Soy.  Flax seeds.  Red clover.  Ginseng.  What menopause symptoms may be helped if I use herbal products?  Vasomotor symptoms. These may be helped by: ? Soy. Some studies show that soy may have a moderate benefit for hot flashes. ? Black cohosh. There is limited evidence indicating this may be beneficial for hot flashes.  Symptoms that are related to heart and blood vessel disease. These may be  helped by soy. Studies have shown that soy can help to lower cholesterol.  Depression. This may be helped by: ? St. John's wort. There is limited evidence that shows this may help mild to moderate depression. ? Black cohosh. There is evidence that this may help depression and mood swings.  Osteoporosis. Soy may help to decrease bone loss that is associated with menopause and may prevent osteoporosis. Limited evidence indicates that red clover may offer some bone loss protection as well. Other herbal products that are commonly used during menopause lack enough evidence to support their use as a replacement for conventional menopause therapies. These products include evening primrose, ginseng, and red clover. What are the cases when herbal products should not be used during menopause? Do not use herbal products during menopause without your health care provider's approval if:  You are taking medicine.  You have a preexisting liver condition.  Are there any risks in my taking herbal products during menopause? If you choose to use herbal products to help with symptoms of menopause, keep in mind that:  Different supplements have different and unmeasured   amounts of herbal ingredients.  Herbal products are not regulated the same way that medicines are.  Concentrations of herbs may vary depending on the way they are prepared. For example, the concentration may be different in a pill, tea, oil, and tincture.  Little is known about the risks of using herbal products, particularly the risks of long-term use.  Some herbal supplements can be harmful when combined with certain medicines.  Most commonly reported side effects of herbal products are mild. However, if used improperly, many herbal supplements can cause serious problems. Talk to your health care provider before starting any herbal product. If problems develop, stop taking the supplement and let your health care provider know. This  information is not intended to replace advice given to you by your health care provider. Make sure you discuss any questions you have with your health care provider. Document Released: 07/22/2007 Document Revised: 12/31/2015 Document Reviewed: 07/18/2013 Elsevier Interactive Patient Education  2017 Elsevier Inc.  

## 2017-08-05 NOTE — Progress Notes (Signed)
GYN VISIT 52 y.o. G0P0000 Single [place] female here for annual exam.    Hot flashes regular but not too bad. Skipped her cycle for 2 months this spring.  Menses in June lasted for one week.  Weight gain.   20 pounds in the last 2 years.  Decreased activity.   Hx fibroids and a small paratubal versus paraovarian cyst by [place] 07/16/16.   PCP: No PCP    Patient's last menstrual period was 07/20/2017 (within days).           Sexually active: Yes.    The current method of family planning is none - patient with female preference.    Exercising: Yes.    gym Smoker:  no  Health Maintenance: Pap:  07/10/16 Pap and HR HPV negative History of abnormal Pap:  no MMG:  07/13/17 BIRADS 1 negative/density c Colonoscopy:  Never.  Declines this.  BMD:   n/a  Result  n/a TDaP:  2016 Gardasil:   n/a HIV: 2015 negative  Hep C: 2015 negative Screening Labs:  Discuss today   reports that she has never smoked. She has never used smokeless tobacco. She reports that she drinks about 1.2 oz of alcohol per week. She reports that she does not use drugs.  No past medical history on file.  Past Surgical History:  Procedure Laterality Date  . breast duct biopsy Left   . OVARIAN CYST REMOVAL Left     No current outpatient medications on file.   No current facility-administered medications for this visit.     Family History  Problem Relation Age of Onset  . Cancer Mother        breast  . Hypertension Father   . Heart disease Father   . Cancer Maternal Grandmother        breast    Review of Systems  Constitutional:       Hot flashes  HENT: Negative.   Eyes: Negative.   Respiratory: Negative.   Cardiovascular: Negative.   Gastrointestinal: Negative.   Endocrine: Negative.   Genitourinary:       Menstrual cycle changes   Musculoskeletal: Negative.   Skin: Negative.   Allergic/Immunologic: Negative.   Neurological: Negative.   Hematological: Negative.   Psychiatric/Behavioral:  Negative.     Exam:   BP 112/72 (BP Location: Right Arm, Patient Position: Sitting, Cuff Size: Normal)   Pulse 84   Resp 16   Ht 5' 3.25" (1.607 m)   Wt 153 lb (69.4 kg)   LMP 07/20/2017 (Within Days)   BMI 26.89 kg/m     General appearance: alert, cooperative and appears stated age Head: Normocephalic, without obvious abnormality, atraumatic Neck: no adenopathy, supple, symmetrical, trachea midline and thyroid normal to inspection and palpation Lungs: clear to auscultation bilaterally Breasts: normal appearance, no masses or tenderness, No nipple retraction or dimpling, No nipple discharge or bleeding, No axillary or supraclavicular adenopathy Heart: regular rate and rhythm Abdomen: soft, non-tender; no masses, no organomegaly Extremities: extremities normal, atraumatic, no cyanosis or edema Skin: Skin color, texture, turgor normal. No rashes or lesions Lymph nodes: Cervical, supraclavicular, and axillary nodes normal. No abnormal inguinal nodes palpated Neurologic: Grossly normal  Pelvic: External genitalia:  no lesions              Urethra:  normal appearing urethra with no masses, tenderness or lesions              Bartholins and Skenes: normal  Vagina: normal appearing vagina with normal color and discharge, no lesions              Cervix: no lesions              Pap taken: No. Bimanual Exam:  Uterus:  normal size, contour, position, consistency, mobility, non-tender              Adnexa: no mass, fullness, tenderness              Rectal exam: Yes.  .  Confirms.              Anus:  normal sphincter tone, no lesions  Chaperone was present for exam.  Assessment:   Well woman visit with normal exam. FH breast cancer mother and mat GM. Status post left ovarian cyst removal.  Weight gain.  Perimenopausal.   Plan: Mammogram screening. Recommended self breast awareness. Pap and HR HPV as above. Guidelines for Calcium, Vitamin D, regular exercise program  including cardiovascular and weight bearing exercise. Routine labs including TSH. IFOB.  Discussion of menopausal changes.  Brochure on menopause.  Questions invited and answered. Referral to genetics counselor for counseling and possible testing.  Follow up annually and prn.   After visit summary provided.

## 2017-08-05 NOTE — Telephone Encounter (Signed)
Appointments scheduled patient aware of date/time/location & phone number

## 2017-08-06 LAB — CBC
Hematocrit: 38.4 % (ref 34.0–46.6)
Hemoglobin: 12.9 g/dL (ref 11.1–15.9)
MCH: 29.4 pg (ref 26.6–33.0)
MCHC: 33.6 g/dL (ref 31.5–35.7)
MCV: 88 fL (ref 79–97)
Platelets: 304 10*3/uL (ref 150–450)
RBC: 4.39 x10E6/uL (ref 3.77–5.28)
RDW: 13.5 % (ref 12.3–15.4)
WBC: 5.8 10*3/uL (ref 3.4–10.8)

## 2017-08-06 LAB — COMPREHENSIVE METABOLIC PANEL
ALT: 12 IU/L (ref 0–32)
AST: 18 IU/L (ref 0–40)
Albumin/Globulin Ratio: 1.4 (ref 1.2–2.2)
Albumin: 4.3 g/dL (ref 3.5–5.5)
Alkaline Phosphatase: 53 IU/L (ref 39–117)
BUN/Creatinine Ratio: 20 (ref 9–23)
BUN: 18 mg/dL (ref 6–24)
Bilirubin Total: 0.6 mg/dL (ref 0.0–1.2)
CO2: 26 mmol/L (ref 20–29)
Calcium: 9.6 mg/dL (ref 8.7–10.2)
Chloride: 102 mmol/L (ref 96–106)
Creatinine, Ser: 0.89 mg/dL (ref 0.57–1.00)
GFR calc Af Amer: 87 mL/min/{1.73_m2} (ref >59)
GFR calc non Af Amer: 76 mL/min/{1.73_m2} (ref >59)
Globulin, Total: 3.1 g/dL (ref 1.5–4.5)
Glucose: 90 mg/dL (ref 65–99)
Potassium: 4.5 mmol/L (ref 3.5–5.2)
Sodium: 140 mmol/L (ref 134–144)
Total Protein: 7.4 g/dL (ref 6.0–8.5)

## 2017-08-06 LAB — LIPID PANEL
Chol/HDL Ratio: 2.7 ratio (ref 0.0–4.4)
Cholesterol, Total: 199 mg/dL (ref 100–199)
HDL: 75 mg/dL (ref >39)
LDL Calculated: 106 mg/dL — ABNORMAL HIGH (ref 0–99)
Triglycerides: 89 mg/dL (ref 0–149)
VLDL Cholesterol Cal: 18 mg/dL (ref 5–40)

## 2017-08-06 LAB — TSH: TSH: 1.57 u[IU]/mL (ref 0.450–4.500)

## 2017-09-15 ENCOUNTER — Encounter: Payer: BC Managed Care – PPO | Admitting: Genetic Counselor

## 2017-09-15 ENCOUNTER — Other Ambulatory Visit: Payer: BC Managed Care – PPO

## 2017-10-11 ENCOUNTER — Inpatient Hospital Stay: Payer: BC Managed Care – PPO | Admitting: Genetic Counselor

## 2017-10-11 ENCOUNTER — Inpatient Hospital Stay: Payer: BC Managed Care – PPO

## 2017-10-31 ENCOUNTER — Telehealth: Payer: Self-pay | Admitting: Obstetrics and Gynecology

## 2017-10-31 NOTE — Telephone Encounter (Signed)
Please contact patient to remind her to send her IFOB in to the lab.  She came up in my reminder box in Epic.  

## 2017-11-01 NOTE — Telephone Encounter (Signed)
My chart message sent to patient reminding her to do IFOB.

## 2018-01-08 ENCOUNTER — Encounter

## 2018-01-10 ENCOUNTER — Ambulatory Visit: Payer: BC Managed Care – PPO | Admitting: Family Medicine

## 2018-01-10 ENCOUNTER — Encounter: Payer: Self-pay | Admitting: Family Medicine

## 2018-01-10 ENCOUNTER — Other Ambulatory Visit: Payer: Self-pay

## 2018-01-10 VITALS — BP 121/82 | HR 85 | Temp 98.3°F | Resp 18 | Ht 63.25 in | Wt 160.2 lb

## 2018-01-10 DIAGNOSIS — R6884 Jaw pain: Secondary | ICD-10-CM

## 2018-01-10 NOTE — Patient Instructions (Addendum)
  Try to get some exercise to relax your body.  If the pain is recurring, take Aleve 2 pills twice daily for about 5 days.  Take with food.  If symptoms continue to persist get rechecked or see your dentist  Return as needed   If you have lab work done today you will be contacted with your lab results within the next 2 weeks.  If you have not heard from [place] then please contact us. The fastest way to get your results is to register for My Chart.   IF you received an x-ray today, you will receive an invoice from [facility]. Please contact [facility] at [phone] with questions or concerns regarding your invoice.   IF you received labwork today, you will receive an invoice from [place]. Please contact [facility] at [phone] with questions or concerns regarding your invoice.   Our billing staff will not be able to assist you with questions regarding bills from these companies.  You will be contacted with the lab results as soon as they are available. The fastest way to get your results is to activate your My Chart account. Instructions are located on the last page of this paperwork. If you have not heard from [place] regarding the results in 2 weeks, please contact this office.

## 2018-01-10 NOTE — Progress Notes (Signed)
Patient ID: Desiree Pruitt, female    DOB: Jul 23, 1965  Age: 52 y.o. MRN: [id number]  Chief Complaint  Patient presents with  . Jaw Pain    both sides comes and goes dull pain started over weekend pain level was a 6.5     Subjective:   Patient has been having pain in her jaw since last week.  It started in the left jaw, just in front of the TM joint.  She has had some dental implants on the left side, and was concerned about them but she is just recently seen her dentist.  She also took a couple of Aleve.  She was doing better but then had the pain on the right jaw.  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  She is under a stressful time in the school year.  Also has undergoing a divorce which is stressful.  She does get her teeth and grind at nighttime.  Current allergies, medications, problem list, past/family and social histories reviewed.  Objective:  BP 121/82   Pulse 85   Temp 98.3 F (36.8 C) (Oral)   Resp 18   Ht 5' 3.25" (1.607 m)   Wt 160 lb 3.2 oz (72.7 kg)   LMP 12/14/2017   SpO2 97%   BMI 28.15 kg/m   No major acute distress.  She pointed also up toward the left temple a little bit, but the temporal artery is non-tender.  Her throat is clear.  No gum erythema.  The jaw joints moves symmetrically.  No real major tenderness at this time.  No nodes in the neck.  Assessment & Plan:   Assessment: 1. Jaw pain       Plan: Jaw joint pain, nonspecific.  Take anti-inflammatories.  Try to relax.  See instructions.  No orders of the defined types were placed in this encounter.   No orders of the defined types were placed in this encounter.        Patient Instructions    Try to get some exercise to relax your body.  If the pain is recurring, take Aleve 2 pills twice daily for about 5 days.  Take with food.  If symptoms continue to persist get rechecked or see your dentist  Return as needed   If you have lab work done today you will be contacted with your lab  results within the next 2 weeks.  If you have not heard from [place] then please contact us. The fastest way to get your results is to register for My Chart.   IF you received an x-ray today, you will receive an invoice from [facility]. Please contact [facility] at [phone] with questions or concerns regarding your invoice.   IF you received labwork today, you will receive an invoice from [place]. Please contact [facility] at [phone] with questions or concerns regarding your invoice.   Our billing staff will not be able to assist you with questions regarding bills from these companies.  You will be contacted with the lab results as soon as they are available. The fastest way to get your results is to activate your My Chart account. Instructions are located on the last page of this paperwork. If you have not heard from [place] regarding the results in 2 weeks, please contact this office.        Return if symptoms worsen or fail to improve.   Ruben Reason, MD 01/10/2018

## 2018-01-15 ENCOUNTER — Ambulatory Visit: Payer: BC Managed Care – PPO | Admitting: Family Medicine

## 2018-07-19 ENCOUNTER — Telehealth: Payer: Self-pay | Admitting: Obstetrics and Gynecology

## 2018-07-19 NOTE — Telephone Encounter (Signed)
Left message on voicemail to call and reschedule cancelled appointment. °

## 2018-08-12 ENCOUNTER — Ambulatory Visit: Payer: BC Managed Care – PPO | Admitting: Obstetrics and Gynecology

## 2018-09-09 ENCOUNTER — Other Ambulatory Visit: Payer: Self-pay

## 2018-09-13 ENCOUNTER — Encounter: Payer: Self-pay | Admitting: Obstetrics and Gynecology

## 2018-09-13 ENCOUNTER — Ambulatory Visit: Payer: BC Managed Care – PPO | Admitting: Obstetrics and Gynecology

## 2018-09-13 ENCOUNTER — Other Ambulatory Visit: Payer: Self-pay

## 2018-09-13 VITALS — BP 112/62 | HR 80 | Temp 97.0°F | Resp 14 | Ht 63.5 in | Wt 158.0 lb

## 2018-09-13 DIAGNOSIS — Z803 Family history of malignant neoplasm of breast: Secondary | ICD-10-CM | POA: Diagnosis not present

## 2018-09-13 DIAGNOSIS — N951 Menopausal and female climacteric states: Secondary | ICD-10-CM

## 2018-09-13 DIAGNOSIS — Z1211 Encounter for screening for malignant neoplasm of colon: Secondary | ICD-10-CM

## 2018-09-13 DIAGNOSIS — Z01419 Encounter for gynecological examination (general) (routine) without abnormal findings: Secondary | ICD-10-CM | POA: Diagnosis not present

## 2018-09-13 NOTE — Progress Notes (Signed)
53 y.o. G0P0000 Single [place] female here for annual exam.    No menses since February, 2020. Hot flashes and restless sleep set in.  She discovered Estrogen with melatonin on [place], and states it works like a miracle per patient.  She still has hot flashes, but she can sleep.  She sent in an IFOB that she had from last year.  No results in Epic.   PCP: No PCP     Patient's last menstrual period was 04/12/2018.           Sexually active: Yes.    The current method of family planning is none.    Exercising: Yes.    workout at home Smoker:  no  Health Maintenance: Pap:  07/10/16 Pap and HR HPV negative History of abnormal Pap:  no MMG:  07/13/17 BIRADS 1 negative/density c Colonoscopy:  Patient has done stool kit in the past TDaP:  2016 HIV and Hep C: 2015 negative Screening Labs: discuss today   reports that she has never smoked. She has never used smokeless tobacco. She reports current alcohol use of about 2.0 standard drinks of alcohol per week. She reports that she does not use drugs.  History reviewed. No pertinent past medical history.  Past Surgical History:  Procedure Laterality Date  . breast duct biopsy Left   . OVARIAN CYST REMOVAL Left     Current Outpatient Medications  Medication Sig Dispense Refill  . Rhubarb (ESTROVEN MENOPAUSE RELIEF PO) Take by mouth daily.     No current facility-administered medications for this visit.     Family History  Problem Relation Age of Onset  . Cancer Mother        breast  . Hypertension Father   . Heart disease Father   . Cancer Maternal Grandmother        breast    Review of Systems  Constitutional: Negative.   HENT: Negative.   Eyes: Negative.   Respiratory: Negative.   Cardiovascular: Negative.   Gastrointestinal: Negative.   Endocrine: Negative.   Genitourinary: Negative.   Musculoskeletal: Negative.   Skin: Negative.   Allergic/Immunologic: Negative.   Neurological: Negative.   Hematological:  Negative.   Psychiatric/Behavioral: Negative.     Exam:   BP 112/62 (BP Location: Left Arm, Patient Position: Sitting, Cuff Size: Normal)   Pulse 80   Temp (!) 97 F (36.1 C) (Temporal)   Resp 14   Ht 5' 3.5" (1.613 m)   Wt 158 lb (71.7 kg)   LMP 04/12/2018   BMI 27.55 kg/m     General appearance: alert, cooperative and appears stated age Head: normocephalic, without obvious abnormality, atraumatic Neck: no adenopathy, supple, symmetrical, trachea midline and thyroid normal to inspection and palpation Lungs: clear to auscultation bilaterally Breasts: normal appearance, no masses or tenderness, No nipple retraction or dimpling, No nipple discharge or bleeding, No axillary adenopathy Heart: regular rate and rhythm Abdomen: soft, non-tender; no masses, no organomegaly Extremities: extremities normal, atraumatic, no cyanosis or edema Skin: skin color, texture, turgor normal. No rashes or lesions Lymph nodes: cervical, supraclavicular, and axillary nodes normal. Neurologic: grossly normal  Pelvic: External genitalia:  no lesions              No abnormal inguinal nodes palpated.              Urethra:  normal appearing urethra with no masses, tenderness or lesions              Bartholins and Skenes:  normal                 Vagina: normal appearing vagina with normal color and discharge, no lesions              Cervix: no lesions              Pap taken: No. Bimanual Exam:  Uterus:  normal size, contour, position, consistency, mobility, non-tender              Adnexa: no mass, fullness, tenderness              Rectal exam: Yes.  .  Confirms.              Anus:  normal sphincter tone, no lesions  Chaperone was present for exam.  Assessment:   Well woman visit with normal exam. FH breast cancer mother and mat GM. Status post left ovarian cyst removal.  Perimenopausal.   Plan: Mammogram screening discussed. Self breast awareness reviewed. Pap and HR HPV as above. Guidelines for  Calcium, Vitamin D, regular exercise program including cardiovascular and weight bearing exercise. Ambulatory referral for genetic counseling.  Routine labs, FSH, E2.  IFOB.  Brochure on menopause. Follow up annually and prn.   After visit summary provided.

## 2018-09-13 NOTE — Patient Instructions (Signed)

## 2018-09-14 ENCOUNTER — Other Ambulatory Visit: Payer: Self-pay | Admitting: Obstetrics and Gynecology

## 2018-09-14 DIAGNOSIS — Z1231 Encounter for screening mammogram for malignant neoplasm of breast: Secondary | ICD-10-CM

## 2018-09-14 LAB — CBC
Hematocrit: 40.8 % (ref 34.0–46.6)
Hemoglobin: 13.3 g/dL (ref 11.1–15.9)
MCH: 29.3 pg (ref 26.6–33.0)
MCHC: 32.6 g/dL (ref 31.5–35.7)
MCV: 90 fL (ref 79–97)
Platelets: 299 10*3/uL (ref 150–450)
RBC: 4.54 x10E6/uL (ref 3.77–5.28)
RDW: 12.6 % (ref 11.7–15.4)
WBC: 6.5 10*3/uL (ref 3.4–10.8)

## 2018-09-14 LAB — COMPREHENSIVE METABOLIC PANEL
ALT: 9 IU/L (ref 0–32)
AST: 14 IU/L (ref 0–40)
Albumin/Globulin Ratio: 1.6 (ref 1.2–2.2)
Albumin: 4.4 g/dL (ref 3.8–4.9)
Alkaline Phosphatase: 50 IU/L (ref 39–117)
BUN/Creatinine Ratio: 16 (ref 9–23)
BUN: 13 mg/dL (ref 6–24)
Bilirubin Total: 0.6 mg/dL (ref 0.0–1.2)
CO2: 25 mmol/L (ref 20–29)
Calcium: 9.1 mg/dL (ref 8.7–10.2)
Chloride: 101 mmol/L (ref 96–106)
Creatinine, Ser: 0.8 mg/dL (ref 0.57–1.00)
GFR calc Af Amer: 99 mL/min/{1.73_m2} (ref >59)
GFR calc non Af Amer: 86 mL/min/{1.73_m2} (ref >59)
Globulin, Total: 2.7 g/dL (ref 1.5–4.5)
Glucose: 76 mg/dL (ref 65–99)
Potassium: 4 mmol/L (ref 3.5–5.2)
Sodium: 140 mmol/L (ref 134–144)
Total Protein: 7.1 g/dL (ref 6.0–8.5)

## 2018-09-14 LAB — LIPID PANEL
Chol/HDL Ratio: 3 ratio (ref 0.0–4.4)
Cholesterol, Total: 199 mg/dL (ref 100–199)
HDL: 66 mg/dL (ref >39)
LDL Calculated: 110 mg/dL — ABNORMAL HIGH (ref 0–99)
Triglycerides: 114 mg/dL (ref 0–149)
VLDL Cholesterol Cal: 23 mg/dL (ref 5–40)

## 2018-09-14 LAB — FOLLICLE STIMULATING HORMONE: FSH: 42.6 m[IU]/mL

## 2018-09-14 LAB — ESTRADIOL: Estradiol: 80.7 pg/mL

## 2018-09-15 ENCOUNTER — Telehealth: Payer: Self-pay

## 2018-09-15 MED ORDER — MEDROXYPROGESTERONE ACETATE 10 MG PO TABS: 10.0000 mg | ORAL_TABLET | Freq: Every day | ORAL | 0 refills | Status: DC

## 2018-09-15 NOTE — Telephone Encounter (Signed)
Spoke with patient. Reviewed lab results per Dr.Silva's note below. Patient voiced understanding. Advised will send in Rx for Provera 10mg  x10 days. She knows it may take up to 2 weeks to have cycle. Patient asked to call and let us know if she bleeds or doesn't bleed.

## 2018-09-15 NOTE — Telephone Encounter (Signed)
-----   Message from Nunzio Cobbs, MD sent at 09/15/2018  6:47 AM EDT ----- Please inform of results of testing.  Her LDL cholesterol is slightly elevated, but the cholesterol ratios are good.  This can be lowered through a diet low in cholesterol and through increasing vigorous exercise.  Her blood counts and metabolic profile are normal.  She is perimenopausal by her labs.  Her [place] is starting to elevate. Her estrogen level is in a premenopausal range, so this is why her hot flashes are better.  I am recommending Provera 10 mg x 10 days to shed the lining of her uterus.  It may take up to 2 weeks for a menstruation to happen.

## 2018-09-23 ENCOUNTER — Other Ambulatory Visit: Payer: Self-pay

## 2018-09-23 ENCOUNTER — Ambulatory Visit
Admission: RE | Admit: 2018-09-23 | Discharge: 2018-09-23 | Disposition: A | Discharge status: Home / Self Care | Payer: BC Managed Care – PPO | Source: Ambulatory Visit | Attending: Obstetrics and Gynecology | Admitting: Obstetrics and Gynecology

## 2018-09-23 DIAGNOSIS — Z1231 Encounter for screening mammogram for malignant neoplasm of breast: Secondary | ICD-10-CM

## 2018-09-23 LAB — FECAL OCCULT BLOOD, IMMUNOCHEMICAL: Fecal Occult Bld: NEGATIVE

## 2018-09-23 LAB — SPECIMEN STATUS REPORT

## 2018-10-01 LAB — SPECIMEN STATUS REPORT

## 2018-10-01 LAB — FECAL OCCULT BLOOD, IMMUNOCHEMICAL: Fecal Occult Bld: NEGATIVE

## 2018-11-16 ENCOUNTER — Ambulatory Visit: Payer: BC Managed Care – PPO

## 2018-11-16 ENCOUNTER — Encounter: Payer: Self-pay | Admitting: Adult Health Nurse Practitioner

## 2018-11-16 ENCOUNTER — Other Ambulatory Visit: Payer: Self-pay

## 2018-11-16 ENCOUNTER — Telehealth (INDEPENDENT_AMBULATORY_CARE_PROVIDER_SITE_OTHER): Payer: BC Managed Care – PPO | Admitting: Adult Health Nurse Practitioner

## 2018-11-16 DIAGNOSIS — M79644 Pain in right finger(s): Secondary | ICD-10-CM

## 2018-11-16 HISTORY — DX: Pain in right finger(s): M79.644

## 2018-11-16 NOTE — Progress Notes (Signed)
Telemedicine Encounter- SOAP NOTE Established Patient  This telephone encounter was conducted with the patient's (or proxy's) verbal consent via audio telecommunications: yes/no: Yes Patient was instructed to have this encounter in a suitably private space; and to only have persons present to whom they give permission to participate. In addition, patient identity was confirmed by use of name plus two identifiers (DOB and address).  I discussed the limitations, risks, security and privacy concerns of performing an evaluation and management service by telephone and the availability of in person appointments. I also discussed with the patient that there may be a patient responsible charge related to this service. The patient expressed understanding and agreed to proceed.  I spent a total of TIME; 0 MIN TO 60 MIN: 15 minutes talking with the patient or their proxy.  Chief Complaint  Patient presents with  . Hand Pain    index finger R hand, started x2 weeks, no injury    Subjective   Desiree Pruitt is a 53 y.o. established patient. Telephone visit today for pain to right index finger   HPI  Patient reports about a 10 history of pain to her right index finger.  Initially started as tenderness to touch on right knuckle.  She does not think there was any swelling.  Denies warmth or erythema.  Noted that the pain to touch on the knuckle has improved but she has had pain and limited movement into her right index finger. It was causing her to alter how she held things from grip standpoint.  She had no mitigating injury.  She does garden and there is a possibility that she could have had a bite to that finger but does not recall any inciting event.  She reports that her mobility has improved somewhat but still not returned to normal.    Patient Active Problem List   Diagnosis Date Noted  . Finger pain, right 11/16/2018  . Routine general medical examination at a health care facility 01/09/2013     Past Medical History:  Diagnosis Date  . Finger pain, right 11/16/2018    Current Outpatient Medications  Medication Sig Dispense Refill  . Rhubarb (ESTROVEN MENOPAUSE RELIEF PO) Take by mouth daily.     No current facility-administered medications for this visit.     Allergies  Allergen Reactions  . Monistat [Miconazole] Rash    burning    Social History   Socioeconomic History  . Marital status: Single    Spouse name: Not on file  . Number of children: Not on file  . Years of education: Not on file  . Highest education level: Not on file  Occupational History  . Occupation: professor  Social Needs  . Financial resource strain: Not on file  . Food insecurity    Worry: Not on file    Inability: Not on file  . Transportation needs    Medical: Not on file    Non-medical: Not on file  Tobacco Use  . Smoking status: Never Smoker  . Smokeless tobacco: Never Used  Substance and Sexual Activity  . Alcohol use: Yes    Alcohol/week: 2.0 standard drinks    Types: 2 Glasses of wine per week  . Drug use: No  . Sexual activity: Yes    Birth control/protection: None  Lifestyle  . Physical activity    Days per week: Not on file    Minutes per session: Not on file  . Stress: Not on file  Relationships  .  Social Herbalist on phone: Not on file    Gets together: Not on file    Attends religious service: Not on file    Active member of club or organization: Not on file    Attends meetings of clubs or organizations: Not on file    Relationship status: Not on file  . Intimate partner violence    Fear of current or ex partner: Not on file    Emotionally abused: Not on file    Physically abused: Not on file    Forced sexual activity: Not on file  Other Topics Concern  . Not on file  Social History Narrative   Married - wife from [place]   Professor - Dance history and theory     ROS   Review of Systems See HPI Constitution: No fevers or chills No  malaise No diaphoresis Skin: No rash or itching Eyes: no blurry vision, no double vision GU: no dysuria or hematuria Neuro: no dizziness or headaches   Objective     GEN: WDWN, NAD, Alert & Oriented x 3 PSYCH: Normally interactive. Conversant. Not depressed or anxious appearing.  Calm demeanor.    Vitals as reported by the patient: There were no vitals filed for this visit.  [name] was seen today for hand pain.  Diagnoses and all orders for this visit:  Finger pain, right -     DG Finger Index Right; Future    We discussed the etiology of RA versus osteoarthritis.  Discussed improvement.  She is taking tumeric currently.  Recommended Advil for antiinflammatory benefits as well.  Will place an xray order.  Should the problem persist or not improve, would recommend imaging and at that point will determine if referral to ortho is necessary.  Patient verbalized understanding and is inline with this plan.    I discussed the assessment and treatment plan with the patient. The patient was provided an opportunity to ask questions and all were answered. The patient agreed with the plan and demonstrated an understanding of the instructions.   The patient was advised to call back or seek an in-person evaluation if the symptoms worsen or if the condition fails to improve as anticipated.  I provided 15 minutes of non-face-to-face time during this encounter.  Glyn Ade, NP  Primary Care at [facility]

## 2018-11-16 NOTE — Patient Instructions (Signed)
How to Use Cold Therapy Cold therapy, also known as cryotherapy, is a treatment that uses cold temperatures to treat an injury or medical condition. It includes using cold packs or ice packs to reduce pain and swelling. What are the risks? Generally, cold therapy is a safe treatment. However, it is not safe for:  People who cannot express pain, such as small children and people who have dementia.  People who have certain conditions, such as: ? A problem in the vessels that slows blood flow to the fingers and toes (Raynaud's syndrome). ? Feeling very cold easily (cold hypersensitivity). ? Numbness or lack of feeling in the area being iced. Cold therapy may be unsafe for people who have other conditions. Do not use cold therapy without your health care provider's approval if you have:  A heart condition.  High blood pressure.  Open or healing wounds.  An infection.  Rheumatoid arthritis.  Poor circulation.  Diabetes.  Certain skin conditions. How can I make a cold pack? When using a cold pack at home to reduce pain and swelling, you can use:  A silica gel cold pack that has been left in the freezer. You can buy this online or in stores.  A sealable plastic bag that has been filled with crushed ice.  A washcloth or paper towels soaked in cold (or ice) water.  A plastic bag of frozen vegetables. Discard when finished using them as a cold pack. Supplies needed:  A cold pack.  A towel. This can be dry or damp, depending on your preference and comfort. How to use cold therapy  1. Have your cold pack ready. 2. Place a towel between the cold pack and your skin, or wrap the cold pack in a towel. 3. Apply the cold pack to the affected area. Apply for no more than 20 minutes at a time. 4. Check your skin after 5 minutes to make sure that there is no skin damage or other signs of problems. Check for: ? White spots on your skin. Your skin may look blotchy or mottled. ? Skin that  looks blue or pale. ? Skin that feels waxy or hard. 5. Repeat these steps as many times each day as told by your health care provider. Always use a towel to avoid direct contact with your skin. Contact a health care provider if:  You develop white spots on your skin. This may give your skin a blotchy or mottled look.  Your skin turns blue or pale.  Your skin becomes waxy or hard.  Your swelling gets worse. Summary  Cold therapy, or cryotherapy, is used to treat an injury or medical condition. It includes using cold packs or ice packs to reduce pain and swelling.  Cold therapy is not safe if you are unable to express pain or have certain conditions.  When using cold packs or ice packs, always place a towel between the cold source and your skin.  Check your skin after 5 minutes of icing it to make sure that there is no skin damage or other signs of problems.  Contact a health care provider if you notice changes in your skin or your swelling gets worse. This information is not intended to replace advice given to you by your health care provider. Make sure you discuss any questions you have with your health care provider. Document Released: 09/29/2010 Document Revised: 11/01/2017 Document Reviewed: 11/01/2017 Elsevier Patient Education  2020 [employer].

## 2018-12-09 ENCOUNTER — Ambulatory Visit (INDEPENDENT_AMBULATORY_CARE_PROVIDER_SITE_OTHER): Payer: BC Managed Care – PPO

## 2018-12-09 ENCOUNTER — Ambulatory Visit: Payer: BC Managed Care – PPO | Admitting: Family Medicine

## 2018-12-09 ENCOUNTER — Other Ambulatory Visit: Payer: Self-pay

## 2018-12-09 DIAGNOSIS — M79644 Pain in right finger(s): Secondary | ICD-10-CM

## 2019-04-27 ENCOUNTER — Ambulatory Visit: Payer: BC Managed Care – PPO | Attending: Internal Medicine

## 2019-04-27 ENCOUNTER — Ambulatory Visit: Payer: BC Managed Care – PPO

## 2019-04-27 DIAGNOSIS — Z23 Encounter for immunization: Secondary | ICD-10-CM

## 2019-04-27 NOTE — Progress Notes (Signed)
   Covid-19 Vaccination Clinic  Name:  Desiree Pruitt    MRN: [id number] DOB: [birth year]  04/27/2019  [name] was observed post Covid-19 immunization for 15 minutes without incident. She was provided with Vaccine Information Sheet and instruction to access the V-Safe system.   [name] was instructed to call 911 with any severe reactions post vaccine: Marland Kitchen Difficulty breathing  . Swelling of face and throat  . A fast heartbeat  . A bad rash all over body  . Dizziness and weakness   Immunizations Administered    Name Date Dose VIS Date Route   Pfizer COVID-19 Vaccine 04/27/2019 10:05 AM 0.3 mL 01/27/2019 Intramuscular   Manufacturer: [place]   Lot: [id number]   [place]: [id number]

## 2019-05-22 ENCOUNTER — Ambulatory Visit: Payer: BC Managed Care – PPO | Attending: Internal Medicine

## 2019-05-22 DIAGNOSIS — Z23 Encounter for immunization: Secondary | ICD-10-CM

## 2019-05-22 NOTE — Progress Notes (Signed)
   Covid-19 Vaccination Clinic  Name:  Masako Piccinini    MRN: [id number] DOB: February 27, 1965  05/22/2019  Ms. Dacy was observed post Covid-19 immunization for 15 minutes without incident. She was provided with Vaccine Information Sheet and instruction to access the V-Safe system.   Ms. Leathem was instructed to call 911 with any severe reactions post vaccine: Marland Kitchen Difficulty breathing  . Swelling of face and throat  . A fast heartbeat  . A bad rash all over body  . Dizziness and weakness   Immunizations Administered    Name Date Dose VIS Date Route   Pfizer COVID-19 Vaccine 05/22/2019  9:43 AM 0.3 mL 01/27/2019 Intramuscular   Manufacturer: [place]   Lot: [id number]   [place]: [id number]

## 2019-09-14 ENCOUNTER — Ambulatory Visit: Payer: BC Managed Care – PPO | Admitting: Obstetrics and Gynecology

## 2019-09-21 NOTE — Progress Notes (Deleted)
54 y.o. G0P0000 Single [place] female here for annual exam.    PCP:     No LMP recorded.           Sexually active: {yes no:314532}  The current method of family planning is {contraception:315051}.    Exercising: {yes no:314532}  {types:19826} Smoker:  no  Health Maintenance: Pap: 07-10-16 Neg:Neg HR HPV, 05-24-13 Neg:Neg HR HPV History of abnormal Pap:  no MMG: 09-23-18 3D/Neg/density C/Birads1 Colonoscopy:  *** BMD:   ***  Result  *** TDaP:  2016 Gardasil:   no HIV: 2015 NR Hep C: 2015 Neg Screening Labs:  Hb today: ***, Urine today: ***   reports that she has never smoked. She has never used smokeless tobacco. She reports current alcohol use of about 2.0 standard drinks of alcohol per week. She reports that she does not use drugs.  Past Medical History:  Diagnosis Date  . Finger pain, right 11/16/2018    Past Surgical History:  Procedure Laterality Date  . breast duct biopsy Left   . OVARIAN CYST REMOVAL Left     Current Outpatient Medications  Medication Sig Dispense Refill  . Rhubarb (ESTROVEN MENOPAUSE RELIEF PO) Take by mouth daily.     No current facility-administered medications for this visit.    Family History  Problem Relation Age of Onset  . Cancer Mother        breast  . Hypertension Father   . Heart disease Father   . Cancer Maternal Grandmother        breast    Review of Systems  Exam:   There were no vitals taken for this visit.    General appearance: alert, cooperative and appears stated age Head: normocephalic, without obvious abnormality, atraumatic Neck: no adenopathy, supple, symmetrical, trachea midline and thyroid normal to inspection and palpation Lungs: clear to auscultation bilaterally Breasts: normal appearance, no masses or tenderness, No nipple retraction or dimpling, No nipple discharge or bleeding, No axillary adenopathy Heart: regular rate and rhythm Abdomen: soft, non-tender; no masses, no organomegaly Extremities:  extremities normal, atraumatic, no cyanosis or edema Skin: skin color, texture, turgor normal. No rashes or lesions Lymph nodes: cervical, supraclavicular, and axillary nodes normal. Neurologic: grossly normal  Pelvic: External genitalia:  no lesions              No abnormal inguinal nodes palpated.              Urethra:  normal appearing urethra with no masses, tenderness or lesions              Bartholins and Skenes: normal                 Vagina: normal appearing vagina with normal color and discharge, no lesions              Cervix: no lesions              Pap taken: {yes no:314532} Bimanual Exam:  Uterus:  normal size, contour, position, consistency, mobility, non-tender              Adnexa: no mass, fullness, tenderness              Rectal exam: {yes no:314532}.  Confirms.              Anus:  normal sphincter tone, no lesions  Chaperone was present for exam.  Assessment:   Well woman visit with normal exam.   Plan: Mammogram screening discussed. Self breast awareness  reviewed. Pap and HR HPV as above. Guidelines for Calcium, Vitamin D, regular exercise program including cardiovascular and weight bearing exercise.   Follow up annually and prn.   Additional counseling given.  {yes [id number]. _______ minutes face to face time of which over 50% was spent in counseling.    After visit summary provided.

## 2019-09-25 ENCOUNTER — Ambulatory Visit: Payer: BC Managed Care – PPO | Admitting: Obstetrics and Gynecology

## 2019-10-25 ENCOUNTER — Other Ambulatory Visit: Payer: Self-pay | Admitting: Family Medicine

## 2019-10-25 DIAGNOSIS — R1032 Left lower quadrant pain: Secondary | ICD-10-CM

## 2019-10-26 ENCOUNTER — Other Ambulatory Visit: Payer: BC Managed Care – PPO

## 2019-11-06 ENCOUNTER — Other Ambulatory Visit: Payer: BC Managed Care – PPO

## 2019-11-20 ENCOUNTER — Ambulatory Visit: Payer: BC Managed Care – PPO | Admitting: Family Medicine

## 2019-11-20 ENCOUNTER — Other Ambulatory Visit: Payer: Self-pay

## 2019-11-20 ENCOUNTER — Other Ambulatory Visit: Payer: Self-pay | Admitting: Obstetrics and Gynecology

## 2019-11-20 ENCOUNTER — Encounter: Payer: Self-pay | Admitting: Family Medicine

## 2019-11-20 VITALS — BP 121/87 | HR 87 | Temp 98.1°F | Resp 18 | Ht 63.5 in | Wt 152.0 lb

## 2019-11-20 DIAGNOSIS — R238 Other skin changes: Secondary | ICD-10-CM

## 2019-11-20 DIAGNOSIS — Z1231 Encounter for screening mammogram for malignant neoplasm of breast: Secondary | ICD-10-CM

## 2019-11-20 MED ORDER — HYDROXYZINE HCL 25 MG PO TABS: 12.5000 mg | ORAL_TABLET | Freq: Three times a day (TID) | ORAL | 0 refills | Status: DC | PRN

## 2019-11-20 MED ORDER — VALACYCLOVIR HCL 1 G PO TABS: 1000.0000 mg | ORAL_TABLET | Freq: Three times a day (TID) | ORAL | 0 refills | Status: DC

## 2019-11-20 NOTE — Patient Instructions (Signed)
° ° ° °  If you have lab work done today you will be contacted with your lab results within the next 2 weeks.  If you have not heard from us then please contact us. The fastest way to get your results is to register for My Chart. ° ° °IF you received an x-ray today, you will receive an invoice from [facility]. Please contact [facility] at [phone] with questions or concerns regarding your invoice.  ° °IF you received labwork today, you will receive an invoice from [facility]. Please contact [facility] at [phone] with questions or concerns regarding your invoice.  ° °Our billing staff will not be able to assist you with questions regarding bills from these companies. ° °You will be contacted with the lab results as soon as they are available. The fastest way to get your results is to activate your My Chart account. Instructions are located on the last page of this paperwork. If you have not heard from us regarding the results in 2 weeks, please contact this office. °  ° ° ° °

## 2019-11-20 NOTE — Progress Notes (Signed)
10/4/202111:44 AM  Desiree Pruitt [birth year], 54 y.o., female [id number]  Chief Complaint  Patient presents with  . Rash    Patient states about 10 days ago she noticed 3 bumps on her chest. Per patient she used some OTC creams and nothing is giving any relief for the extreme itching. Rash has now moving from chest to arms and sides.     HPI:   Patient is a 54 y.o. female who presents today for rash  Patient reports that about 10 days ago she had red inflammed bump rash along left upper chest wall, above her breast, slightly itchy She applied hydrocortisone 2.5% every night and almost completely went away  Then 2 days ago started having small vesicles show up on her arms, back, chest These are burning and itchy, she can feel when a new one is showing She denies any exposures or new products She has not had similar rash before  Depression screen [facility] 2/9 11/20/2019 01/10/2018 12/09/2016  Decreased Interest 0 0 0  Down, Depressed, Hopeless 0 0 0  PHQ - 2 Score 0 0 0    Fall Risk  11/20/2019 01/10/2018 12/09/2016 01/16/2016  Falls in the past year? 0 0 No No  Number falls in past yr: 0 - - -  Injury with Fall? 0 - - -  Follow up Falls evaluation completed - - -     Allergies  Allergen Reactions  . Monistat [Miconazole] Rash    burning    Prior to Admission medications   Medication Sig Start Date End Date Taking? Authorizing Provider  Multiple Vitamin (MULTIVITAMIN) tablet Take 1 tablet by mouth daily.   Yes [provider]  Rhubarb (ESTROVEN MENOPAUSE RELIEF PO) Take by mouth daily. Patient not taking: Reported on 11/20/2019    [provider]    Past Medical History:  Diagnosis Date  . Finger pain, right 11/16/2018    Past Surgical History:  Procedure Laterality Date  . breast duct biopsy Left   . OVARIAN CYST REMOVAL Left     Social History   Tobacco Use  . Smoking status: Never Smoker  . Smokeless tobacco: Never Used  Substance Use  Topics  . Alcohol use: Yes    Alcohol/week: 2.0 standard drinks    Types: 2 Glasses of wine per week    Family History  Problem Relation Age of Onset  . Cancer Mother        breast  . Hypertension Father   . Heart disease Father   . Cancer Maternal Grandmother        breast    ROS Per hpi  OBJECTIVE:  Today's Vitals   11/20/19 1140  BP: 121/87  Pulse: 87  Resp: 18  Temp: 98.1 F (36.7 C)  TempSrc: Temporal  SpO2: 97%  Weight: 152 lb (68.9 kg)  Height: 5' 3.5" (1.613 m)   Body mass index is 26.5 kg/m.   Physical Exam   Gen: AAOx3, NAD      No results found for this or any previous visit (from the past 24 hour(s)).  No results found.   ASSESSMENT and PLAN  1. Vesicular rash Atypical distribution, but given h/o treating empirically. Reviewed new med r/se/b.  - Herpes simplex virus culture  Other orders - Multiple Vitamin (MULTIVITAMIN) tablet; Take 1 tablet by mouth daily. - valACYclovir (VALTREX) 1000 MG tablet; Take 1 tablet (1,000 mg total) by mouth 3 (three) times daily. - hydrOXYzine (ATARAX/VISTARIL) 25 MG tablet; Take 0.5-1  tablets (12.5-25 mg total) by mouth every 8 (eight) hours as needed for itching.  Return in about 1 week (around 11/27/2019) for Kelsea Just - rash.    Rutherford Guys, MD [facility] [place], [place] [address] Ph.  [phone] Fax [phone]

## 2019-11-22 LAB — HERPES SIMPLEX VIRUS CULTURE

## 2019-11-27 ENCOUNTER — Ambulatory Visit: Payer: BC Managed Care – PPO | Admitting: Family Medicine

## 2019-12-06 ENCOUNTER — Ambulatory Visit
Admission: RE | Admit: 2019-12-06 | Discharge: 2019-12-06 | Disposition: A | Discharge status: Home / Self Care | Payer: BC Managed Care – PPO | Source: Ambulatory Visit

## 2019-12-06 ENCOUNTER — Other Ambulatory Visit: Payer: Self-pay

## 2019-12-06 DIAGNOSIS — Z1231 Encounter for screening mammogram for malignant neoplasm of breast: Secondary | ICD-10-CM

## 2020-01-08 LAB — COLOGUARD: COLOGUARD: NEGATIVE

## 2020-03-21 NOTE — Progress Notes (Signed)
55 y.o. G0P0000 Divorced [place] female here for annual exam.    Feeling more allergic now that she is in menopause.   Hot flashes are not as significant.  Not taking Estroven or black cohosh.   Using hydroxyzine as needed to help her sleep.   Patient has had all 3 Covid vaccines--Phizer. Completed flu vaccine.   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   PCP: Bernerd Limbo, MD   Patient's last menstrual period was 04/12/2018.           Sexually active: No.  The current method of family planning is none.    Exercising: No.  some swimming, some yoga--has injured knee Smoker:  no  Health Maintenance:  Pap: 07-10-16 Neg:Neg HR HPV History of abnormal Pap:  no MMG:  12-06-19 Neg/BiRads1 Colonoscopy: NEVER--Neg cologuard recently with Dr.Bouska BMD:   n/a  Result  n/a TDaP:  2016 Gardasil:   no HIV:2015 NR Hep C:2015 Neg Screening Labs:  PCP.    reports that she has never smoked. She has never used smokeless tobacco. She reports current alcohol use of about 2.0 standard drinks of alcohol per week. She reports that she does not use drugs.  Past Medical History:  Diagnosis Date  . Finger pain, right 11/16/2018    Past Surgical History:  Procedure Laterality Date  . breast duct biopsy Left   . OVARIAN CYST REMOVAL Left     Current Outpatient Medications  Medication Sig Dispense Refill  . hydrOXYzine (ATARAX/VISTARIL) 25 MG tablet Take 0.5-1 tablets (12.5-25 mg total) by mouth every 8 (eight) hours as needed for itching. 30 tablet 0  . meloxicam (MOBIC) 7.5 MG tablet Take 7.5 mg by mouth as needed for pain.    Marland Kitchen triamcinolone ointment (KENALOG) 0.1 % Apply 1 application topically 2 (two) times daily.     No current facility-administered medications for this visit.    Family History  Problem Relation Age of Onset  . Cancer Mother        breast  . Hypertension Father   . Heart disease Father   . Cancer Maternal Grandmother        breast    Review of Systems   All other systems reviewed and are negative.   Exam:   BP 122/70   Pulse 74   Ht 5\' 4"  (1.626 m)   Wt 157 lb (71.2 kg)   LMP 04/12/2018   SpO2 97%   BMI 26.95 kg/m     General appearance: alert, cooperative and appears stated age Head: normocephalic, without obvious abnormality, atraumatic Neck: no adenopathy, supple, symmetrical, trachea midline and thyroid normal to inspection and palpation Lungs: clear to auscultation bilaterally Breasts: normal appearance, no masses or tenderness, No nipple retraction or dimpling, No nipple discharge or bleeding, No axillary adenopathy Heart: regular rate and rhythm Abdomen: soft, non-tender; no masses, no organomegaly Extremities: extremities normal, atraumatic, no cyanosis or edema Skin: skin color, texture, turgor normal. No rashes or lesions Lymph nodes: cervical, supraclavicular, and axillary nodes normal. Neurologic: grossly normal  Pelvic: External genitalia:  no lesions              No abnormal inguinal nodes palpated.              Urethra:  normal appearing urethra with no masses, tenderness or lesions              Bartholins and Skenes: normal  Vagina: normal appearing vagina with normal color and discharge, no lesions              Cervix: no lesions              Pap taken: No. Bimanual Exam:  Uterus:  normal size, contour, position, consistency, mobility, non-tender              Adnexa: no mass, fullness, tenderness              Rectal exam: Yes.  .  Confirms.              Anus:  normal sphincter tone, small hemorrhoid at 3:00.  Chaperone was present for exam.  Assessment:   Well woman visit with normal exam. FH breast cancer mother and mat GM. Status post left ovarian cyst removal.  Plan: Mammogram screening discussed. Self breast awareness reviewed. Pap and HR HPV as above. Guidelines for Calcium, Vitamin D, regular exercise program including cardiovascular and weight bearing exercise. Labs with PCP.    Follow up annually and prn.

## 2020-03-22 ENCOUNTER — Encounter: Payer: Self-pay | Admitting: Obstetrics and Gynecology

## 2020-03-22 ENCOUNTER — Ambulatory Visit (INDEPENDENT_AMBULATORY_CARE_PROVIDER_SITE_OTHER): Payer: BC Managed Care – PPO | Admitting: Obstetrics and Gynecology

## 2020-03-22 ENCOUNTER — Other Ambulatory Visit: Payer: Self-pay

## 2020-03-22 VITALS — BP 122/70 | HR 74 | Ht 64.0 in | Wt 157.0 lb

## 2020-03-22 DIAGNOSIS — Z01419 Encounter for gynecological examination (general) (routine) without abnormal findings: Secondary | ICD-10-CM | POA: Diagnosis not present

## 2020-03-22 NOTE — Patient Instructions (Signed)

## 2020-03-28 ENCOUNTER — Ambulatory Visit: Payer: BC Managed Care – PPO | Admitting: Obstetrics and Gynecology

## 2021-03-13 ENCOUNTER — Other Ambulatory Visit: Payer: Self-pay | Admitting: Obstetrics and Gynecology

## 2021-03-13 DIAGNOSIS — Z1231 Encounter for screening mammogram for malignant neoplasm of breast: Secondary | ICD-10-CM

## 2021-03-18 IMAGING — MG DIGITAL SCREENING BILAT W/ TOMO W/ CAD
8 series · 8 of 24 positions shown · non-contrast
Comparison: Previous exam(s).

CLINICAL DATA: Screening.

EXAM:
DIGITAL SCREENING BILATERAL MAMMOGRAM WITH TOMO AND CAD

[L CC synth-2D]
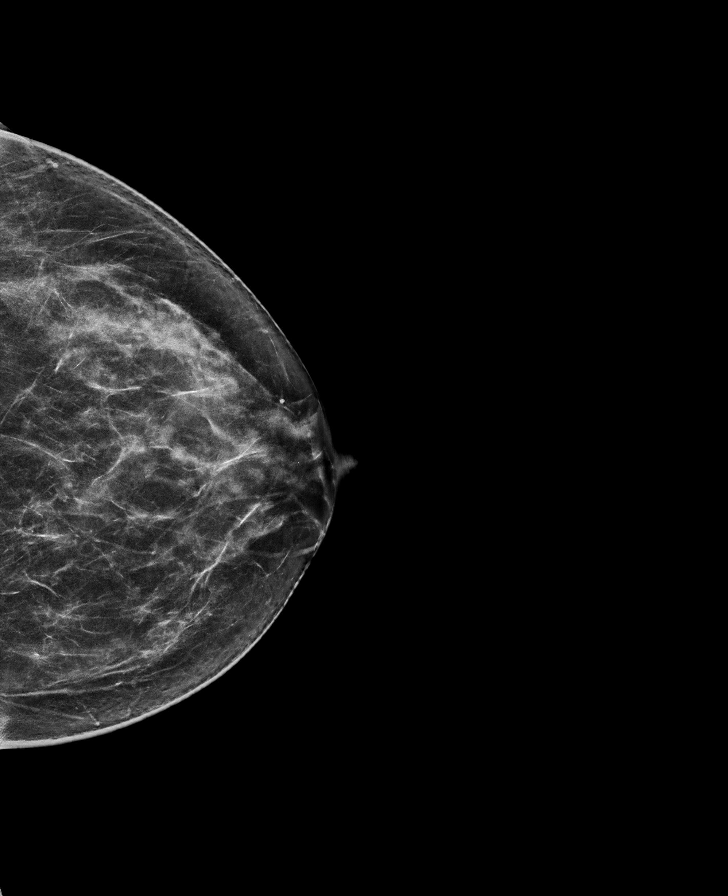

[R MLO synth-2D]
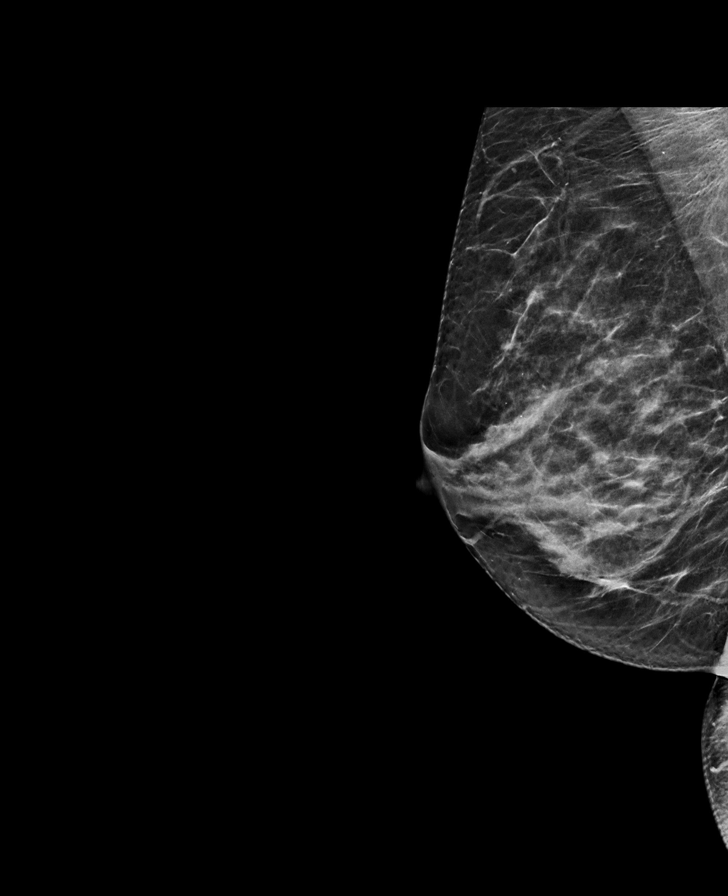

[R CC synth-2D]
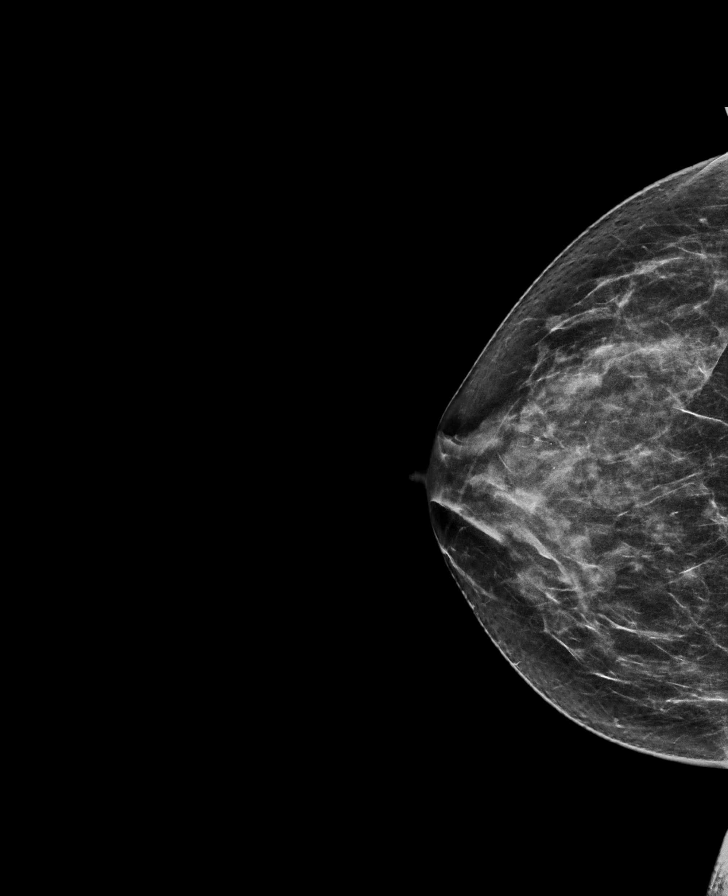

[L MLO synth-2D]
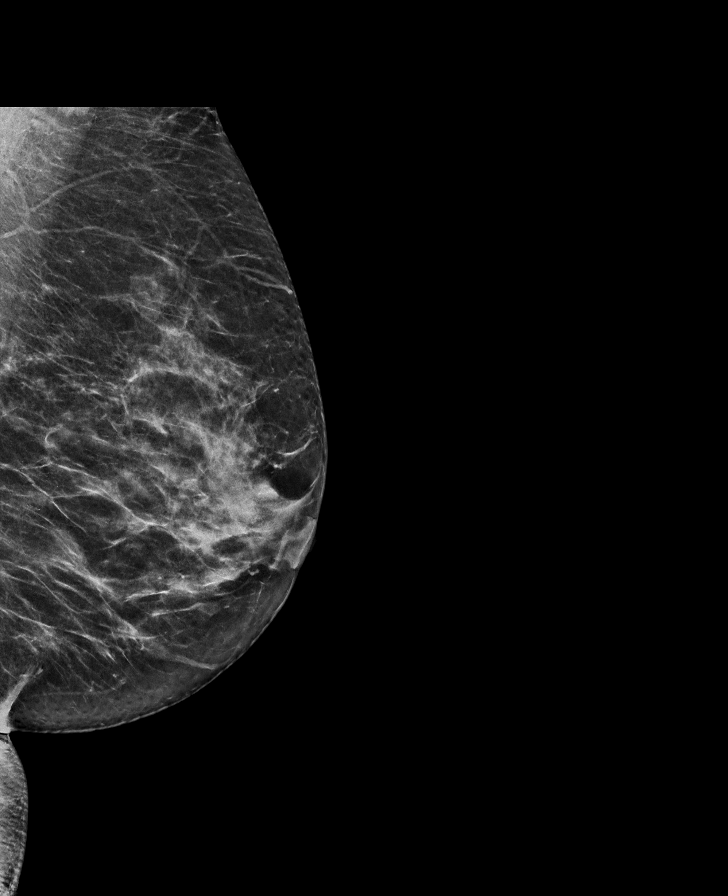

[R MLO tomo · tomo slice 32/63.0]
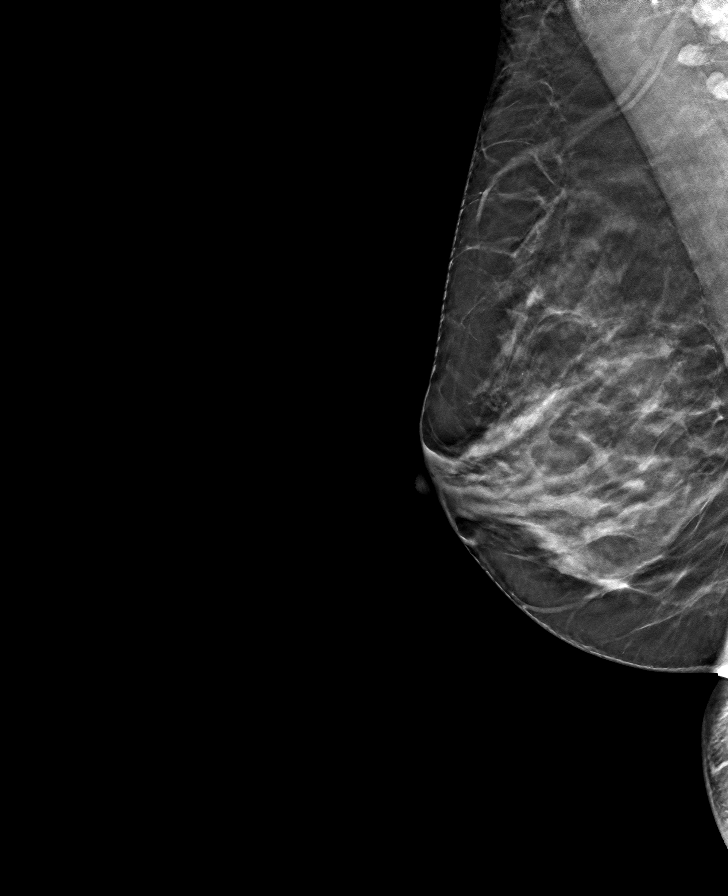

[L CC tomo · tomo slice 35/69.0]
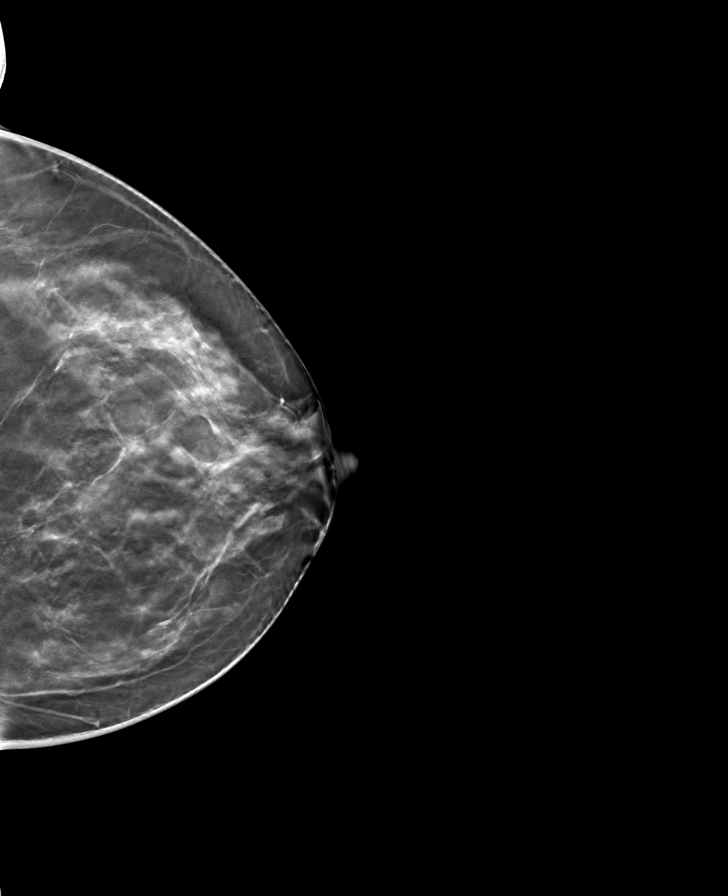

[R CC tomo · tomo slice 32/63.0]
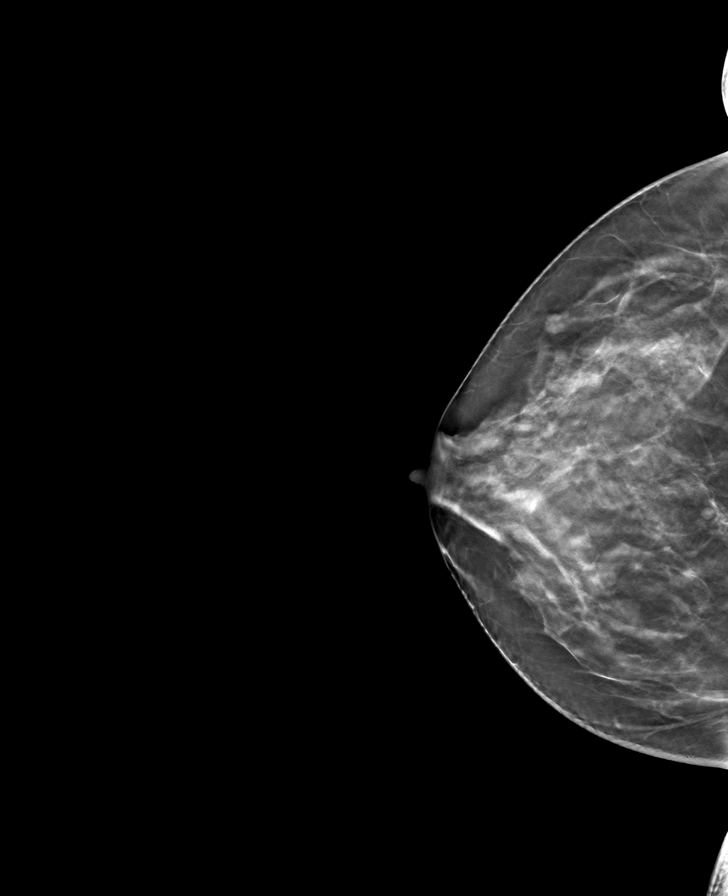

[L MLO tomo · tomo slice 33/66.0]
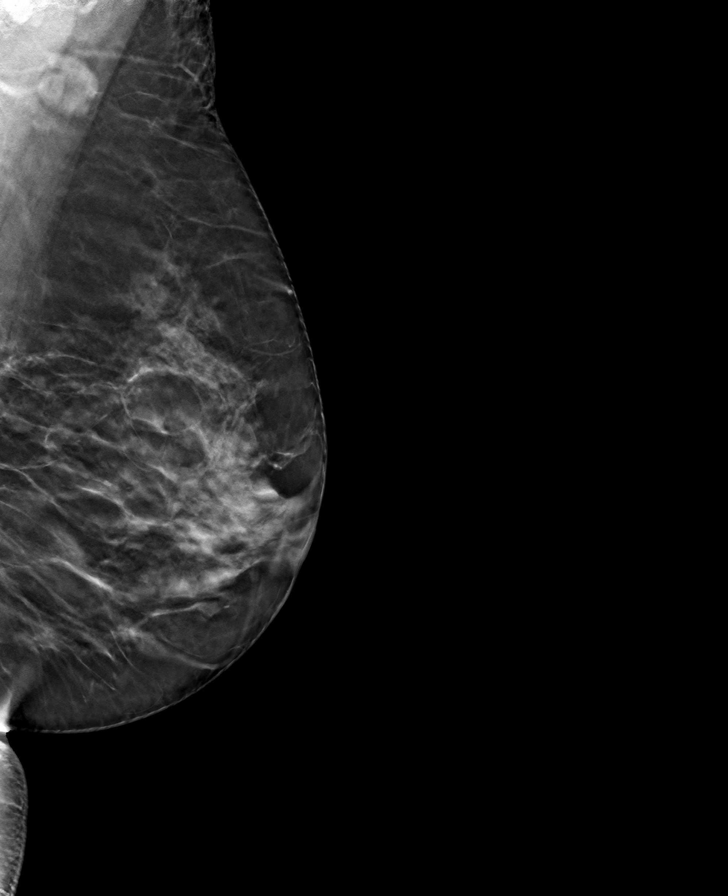

[8 of 24 positions shown; findings below may reference images not displayed]

ACR Breast Density Category c: The breast tissue is heterogeneously
dense, which may obscure small masses.
FINDINGS: There are no findings suspicious for malignancy. Images were
processed with CAD.
IMPRESSION: No mammographic evidence of malignancy. A result letter of this
screening mammogram will be mailed directly to the patient.

RECOMMENDATION:
Screening mammogram in one year. (Code:FT-U-LHB)

BI-RADS CATEGORY  1: Negative.

## 2021-03-22 DIAGNOSIS — Z8616 Personal history of COVID-19: Secondary | ICD-10-CM

## 2021-03-22 HISTORY — DX: Personal history of COVID-19: Z86.16

## 2021-03-24 ENCOUNTER — Ambulatory Visit: Payer: Self-pay

## 2021-04-01 NOTE — Progress Notes (Signed)
56 y.o. [place] female here for annual exam.    Had Covid 03-22-21. No antiviral medication.   Had labs with PCP.   May move to [place] for new work.   PCP:  Bernerd Limbo, MD   Patient's last menstrual period was 04/12/2018.           Sexually active: No.  The current method of family planning is post menopausal status.    Exercising: Yes.     Swimming 2-3x/week, cardio Smoker:  no  Health Maintenance: Pap:  07-10-16 Neg:Neg HR HPV, 05-24-13 Neg:Neg HR HPV History of abnormal Pap:  no MMG:  12-06-19 Neg/Birads1. Has appt. 04-08-21 Colonoscopy: 2021 Neg cologuard with PCP BMD:   n/a  Result  n/a TDaP:  2016 Gardasil:   no HIV: 2015 NR Hep C: 2015 Neg Screening Labs:  PCP   reports that she has never smoked. She has never used smokeless tobacco. She reports current alcohol use of about 2.0 standard drinks per week. She reports that she does not use drugs.  Past Medical History:  Diagnosis Date   Finger pain, right 11/16/2018   History of COVID-19 03/22/2021    Past Surgical History:  Procedure Laterality Date   breast duct biopsy Left    OVARIAN CYST REMOVAL Left     Current Outpatient Medications  Medication Sig Dispense Refill   cetirizine (ZYRTEC) 10 MG tablet Take by mouth.     tacrolimus (PROTOPIC) 0.1 % ointment Apply topically 2 (two) times daily.     No current facility-administered medications for this visit.    Family History  Problem Relation Age of Onset   Cancer Mother        breast   Hypertension Father    Heart disease Father    Cancer Maternal Grandmother        breast    Review of Systems  All other systems reviewed and are negative.  Exam:   BP 122/70    Pulse 85    Ht 5' 3.5" (1.613 m)    Wt 147 lb (66.7 kg)    LMP 04/12/2018    SpO2 98%    BMI 25.63 kg/m     General appearance: alert, cooperative and appears stated age Head: normocephalic, without obvious abnormality, atraumatic Neck: no adenopathy, supple, symmetrical,  trachea midline and thyroid normal to inspection and palpation Lungs: clear to auscultation bilaterally Breasts: normal appearance, no masses or tenderness, No nipple retraction or dimpling, No nipple discharge or bleeding, No axillary adenopathy Heart: regular rate and rhythm Abdomen: soft, non-tender; no masses, no organomegaly Extremities: extremities normal, atraumatic, no cyanosis or edema Skin: skin color, texture, turgor normal. No rashes or lesions Lymph nodes: cervical, supraclavicular, and axillary nodes normal. Neurologic: grossly normal  Pelvic: External genitalia:  no lesions              No abnormal inguinal nodes palpated.              Urethra:  normal appearing urethra with no masses, tenderness or lesions              Bartholins and Skenes: normal                 Vagina: normal appearing vagina with normal color and discharge, no lesions              Cervix: no lesions              Pap taken: yes Bimanual Exam:  Uterus:  normal size, contour, position, consistency, mobility, non-tender              Adnexa: no mass, fullness, tenderness              Rectal exam: yes.  Confirms.              Anus:  normal sphincter tone, hemorrhoid just inside the anus on left.   Chaperone was present for exam:  Estill Bamberg, CMA  Assessment:   Well woman visit with gynecologic exam. FH breast cancer mother and mat GM. Status post left ovarian cyst removal.  Elevated calcium level.  Recent Covid infection.   Plan: Mammogram screening 4 - 6 weeks post Covid infection.  Self breast awareness reviewed. Pap and HR HPV as above. Guidelines for Calcium, Vitamin D, regular exercise program including cardiovascular and weight bearing exercise. Check BMP. We discussed colonoscopy for her next colon cancer screening in 2024.  Follow up annually and prn.   After visit summary provided.

## 2021-04-02 ENCOUNTER — Encounter: Payer: Self-pay | Admitting: Obstetrics and Gynecology

## 2021-04-02 ENCOUNTER — Ambulatory Visit (INDEPENDENT_AMBULATORY_CARE_PROVIDER_SITE_OTHER): Payer: BC Managed Care – PPO | Admitting: Obstetrics and Gynecology

## 2021-04-02 ENCOUNTER — Telehealth: Payer: Self-pay | Admitting: *Deleted

## 2021-04-02 ENCOUNTER — Other Ambulatory Visit: Payer: Self-pay

## 2021-04-02 ENCOUNTER — Other Ambulatory Visit (HOSPITAL_COMMUNITY)
Admission: RE | Admit: 2021-04-02 | Discharge: 2021-04-02 | Disposition: A | Discharge status: Home / Self Care | Payer: Self-pay | Source: Ambulatory Visit | Attending: Obstetrics and Gynecology | Admitting: Obstetrics and Gynecology

## 2021-04-02 VITALS — BP 122/70 | HR 85 | Ht 63.5 in | Wt 147.0 lb

## 2021-04-02 DIAGNOSIS — Z01419 Encounter for gynecological examination (general) (routine) without abnormal findings: Secondary | ICD-10-CM

## 2021-04-02 DIAGNOSIS — Z124 Encounter for screening for malignant neoplasm of cervix: Secondary | ICD-10-CM | POA: Insufficient documentation

## 2021-04-02 LAB — BASIC METABOLIC PANEL
BUN: 11 mg/dL (ref 7–25)
CO2: 31 mmol/L (ref 20–32)
Calcium: 9.5 mg/dL (ref 8.6–10.4)
Chloride: 103 mmol/L (ref 98–110)
Creat: 0.83 mg/dL (ref 0.50–1.03)
Glucose, Bld: 88 mg/dL (ref 65–99)
Potassium: 4.3 mmol/L (ref 3.5–5.3)
Sodium: 139 mmol/L (ref 135–146)

## 2021-04-02 NOTE — Telephone Encounter (Signed)
Patient was seen today for annual exam, called stating she was not asked by the lab if she was fasting. Patient said she was fasting, but just wanted someone at the office to know this. I called patient back and told I received her message, and Dr.Silva ordered a BMET. Fasting would have not been needed for this lab test. Patient was appreciative of the follow up phone call.   Encounter closed.

## 2021-04-02 NOTE — Patient Instructions (Signed)

## 2021-04-03 LAB — CYTOLOGY - PAP
Comment: NEGATIVE
Diagnosis: NEGATIVE
High risk HPV: NEGATIVE

## 2021-04-08 ENCOUNTER — Ambulatory Visit: Payer: Self-pay

## 2021-05-01 ENCOUNTER — Ambulatory Visit
Admission: RE | Admit: 2021-05-01 | Discharge: 2021-05-01 | Disposition: A | Discharge status: Home / Self Care | Payer: BC Managed Care – PPO | Source: Ambulatory Visit | Attending: Obstetrics and Gynecology | Admitting: Obstetrics and Gynecology

## 2021-05-02 ENCOUNTER — Other Ambulatory Visit: Payer: Self-pay | Admitting: Obstetrics and Gynecology

## 2021-05-02 DIAGNOSIS — R928 Other abnormal and inconclusive findings on diagnostic imaging of breast: Secondary | ICD-10-CM

## 2021-05-16 ENCOUNTER — Ambulatory Visit
Admission: RE | Admit: 2021-05-16 | Discharge: 2021-05-16 | Disposition: A | Discharge status: Home / Self Care | Payer: BC Managed Care – PPO | Source: Ambulatory Visit | Attending: Obstetrics and Gynecology | Admitting: Obstetrics and Gynecology

## 2021-05-16 ENCOUNTER — Ambulatory Visit: Payer: BC Managed Care – PPO

## 2021-05-16 DIAGNOSIS — R928 Other abnormal and inconclusive findings on diagnostic imaging of breast: Secondary | ICD-10-CM

## 2021-09-01 ENCOUNTER — Telehealth: Payer: Self-pay

## 2021-09-01 NOTE — Telephone Encounter (Signed)
Patient called. She is in [place]. (She is from [place] She will be returning to [place] in less than 2 weeks.  She said she was starting a new relationship and went for STI testing. She said there in [place] you can just walk into a lab and order for yourself. You do not have to see a MD.    She said she tested positive for something that is not tested for in [place]. She is very upset and worried and hoping Dr. Quincy Simmonds could explain her result to her. Said she cannot wait for 2 weeks when she returns "I'm freaking out."  Positive for Ureaplasm Parvum-genetic particles detected.

## 2021-09-02 NOTE — Telephone Encounter (Signed)
I spoke with patient and read her Dr. Elza Rafter reply.  She was grateful to Dr. Quincy Simmonds for the information.  She is asymptomatic and was only checking STI panel because she may be sexually active after 3 years and wanted to be safe.

## 2021-09-02 NOTE — Telephone Encounter (Signed)
Ureaplasma is a organism that normally exists in the urogenital tract.  Many healthy and asymptomatic adults are colonized with this organism, so it is not usually part of routine STD evaluation.   If detected, it is not treated as an infection unless someone is symptomatic.   If she is having pelvic pain, bleeding, or unusual discharge, I recommend she see a gynecologist while she is in [place] so she can be evaluated and treated.

## 2022-03-26 NOTE — Progress Notes (Signed)
56 y.o. G0P0000 Single [place] female here for annual exam.  Pt noticed very faint, small spots of blood about two days ago.   Pt used OTC estrogen suppositories in October but did not continue use.  Is now sexually active with new partner. Had sex the week before the spotting occurred.   Continues at [facility].   PCP:   Bernerd Limbo, MD  Patient's last menstrual period was 04/12/2018.           Sexually active: Yes.    The current method of family planning is post menopausal status.    Exercising: Yes.     Swimming twice a week, occ yoga  and walk Smoker:  no  Health Maintenance: Pap:  04/02/21 neg: HR HPV neg, 07/10/16 neg: HR HPV neg History of abnormal Pap:  no MMG:  05/16/21 Breast Density Category C, BI-RADS CATEGORY 1 Neg Colonoscopy:  Cologuard - neg 12/29/19 BMD:   n/a  Result  n/a TDaP:  02/24/14 Gardasil:   no HIV:2015 NR Hep C: 2015 Neg Screening Labs:  PCP   reports that she has never smoked. She has never used smokeless tobacco. She reports current alcohol use of about 2.0 standard drinks of alcohol per week. She reports that she does not use drugs.  Past Medical History:  Diagnosis Date   Finger pain, right 11/16/2018   History of COVID-19 03/22/2021    Past Surgical History:  Procedure Laterality Date   breast duct biopsy Left    BREAST LUMPECTOMY Left 02/17/1996   left duct removed around age 22   OVARIAN CYST REMOVAL Left     Current Outpatient Medications  Medication Sig Dispense Refill   cetirizine (ZYRTEC) 10 MG tablet Take by mouth.     hydrOXYzine (ATARAX) 10 MG tablet Take by mouth.     albuterol (VENTOLIN HFA) 108 (90 Base) MCG/ACT inhaler SMARTSIG:1-2 Puff(s) Via Inhaler Every 4-6 Hours PRN (Patient not taking: Reported on 04/08/2022)     EPINEPHrine (EPIPEN 2-PAK) 0.3 mg/0.3 mL IJ SOAJ injection 0.3 mLs (0.3 mg dose). (Patient not taking: Reported on 04/08/2022)     No current facility-administered medications for this visit.    Family History   Problem Relation Age of Onset   Cancer Mother        breast   Hypertension Father    Heart disease Father    Cancer Maternal Grandmother        breast    Review of Systems  All other systems reviewed and are negative.   Exam:   BP 112/68 (BP Location: Left Arm, Patient Position: Sitting, Cuff Size: Normal)   Pulse 80   Ht 5' 3.5" (1.613 m)   Wt 146 lb (66.2 kg)   LMP 04/12/2018   SpO2 98%   BMI 25.46 kg/m     General appearance: alert, cooperative and appears stated age Head: normocephalic, without obvious abnormality, atraumatic Neck: no adenopathy, supple, symmetrical, trachea midline and thyroid normal to inspection and palpation Lungs: clear to auscultation bilaterally Breasts: normal appearance, no masses or tenderness, No nipple retraction or dimpling, No nipple discharge or bleeding, No axillary adenopathy Heart: regular rate and rhythm Abdomen: soft, non-tender; no masses, no organomegaly Extremities: extremities normal, atraumatic, no cyanosis or edema Skin: skin color, texture, turgor normal. No rashes or lesions Lymph nodes: cervical, supraclavicular, and axillary nodes normal. Neurologic: grossly normal  Pelvic: External genitalia:  no lesions              No abnormal  inguinal nodes palpated.              Urethra:  normal appearing urethra with no masses, tenderness or lesions              Bartholins and Skenes: normal                 Vagina: normal appearing vagina with normal color and discharge, no lesions              Cervix: no lesions              Pap taken: yes Bimanual Exam:  Uterus:  normal size, contour, position, consistency, mobility, non-tender              Adnexa: no mass, fullness, tenderness              Rectal exam: yes.  Confirms.              Anus:  normal sphincter tone, no lesions  Chaperone was present for exam:  Raquel Sarna  Assessment:   Well woman visit with gynecologic exam. Postmenopausal bleeding.  STD screening.  FH breast  cancer mother and mat GM. Status post left ovarian cyst removal.  Elevated calcium level.   Plan: Mammogram screening discussed. Self breast awareness reviewed. Pap and HR HPV collected. Guidelines for Calcium, Vitamin D, regular exercise program including cardiovascular and weight bearing exercise. STD screening.  She will do colonoscopy next year.  Etiologies for postmenopausal bleeding discussed:  atrophy, polyps, ovarian cysts, cancer, and precancer. Return for pelvic US.   Follow up annually and prn.  After visit summary provided.

## 2022-04-08 ENCOUNTER — Other Ambulatory Visit (HOSPITAL_COMMUNITY)
Admission: RE | Admit: 2022-04-08 | Discharge: 2022-04-08 | Disposition: A | Discharge status: Home / Self Care | Payer: BC Managed Care – PPO | Source: Ambulatory Visit | Attending: Obstetrics and Gynecology | Admitting: Obstetrics and Gynecology

## 2022-04-08 ENCOUNTER — Ambulatory Visit (INDEPENDENT_AMBULATORY_CARE_PROVIDER_SITE_OTHER): Payer: BC Managed Care – PPO | Admitting: Obstetrics and Gynecology

## 2022-04-08 ENCOUNTER — Encounter: Payer: Self-pay | Admitting: Obstetrics and Gynecology

## 2022-04-08 VITALS — BP 112/68 | HR 80 | Ht 63.5 in | Wt 146.0 lb

## 2022-04-08 DIAGNOSIS — Z124 Encounter for screening for malignant neoplasm of cervix: Secondary | ICD-10-CM

## 2022-04-08 DIAGNOSIS — Z01419 Encounter for gynecological examination (general) (routine) without abnormal findings: Secondary | ICD-10-CM

## 2022-04-08 DIAGNOSIS — Z113 Encounter for screening for infections with a predominantly sexual mode of transmission: Secondary | ICD-10-CM | POA: Insufficient documentation

## 2022-04-08 DIAGNOSIS — Z114 Encounter for screening for human immunodeficiency virus [HIV]: Secondary | ICD-10-CM

## 2022-04-08 DIAGNOSIS — N950 Postmenopausal bleeding: Secondary | ICD-10-CM | POA: Diagnosis not present

## 2022-04-08 DIAGNOSIS — Z1159 Encounter for screening for other viral diseases: Secondary | ICD-10-CM

## 2022-04-08 NOTE — Patient Instructions (Signed)
EXERCISE AND DIET:  We recommended that you start or continue a regular exercise program for good health. Regular exercise means any activity that makes your heart beat faster and makes you sweat.  We recommend exercising at least 30 minutes per day at least 3 days a week, preferably 4 or 5.  We also recommend a diet low in fat and sugar.  Inactivity, poor dietary choices and obesity can cause diabetes, heart attack, stroke, and kidney damage, among others.    ALCOHOL AND SMOKING:  Women should limit their alcohol intake to no more than 7 drinks/beers/glasses of wine (combined, not each!) per week. Moderation of alcohol intake to this level decreases your risk of breast cancer and liver damage. And of course, no recreational drugs are part of a healthy lifestyle.  And absolutely no smoking or even second hand smoke. Most people know smoking can cause heart and lung diseases, but did you know it also contributes to weakening of your bones? Aging of your skin?  Yellowing of your teeth and nails?  CALCIUM AND VITAMIN D:  Adequate intake of calcium and Vitamin D are recommended.  The recommendations for exact amounts of these supplements seem to change often, but generally speaking 600 mg of calcium (either carbonate or citrate) and 800 units of Vitamin D per day seems prudent. Certain women may benefit from higher intake of Vitamin D.  If you are among these women, your doctor will have told you during your visit.    PAP SMEARS:  Pap smears, to check for cervical cancer or precancers,  have traditionally been done yearly, although recent scientific advances have shown that most women can have pap smears less often.  However, every woman still should have a physical exam from her gynecologist every year. It will include a breast check, inspection of the vulva and vagina to check for abnormal growths or skin changes, a visual exam of the cervix, and then an exam to evaluate the size and shape of the uterus and  ovaries.  And after 56 years of age, a rectal exam is indicated to check for rectal cancers. We will also provide age appropriate advice regarding health maintenance, like when you should have certain vaccines, screening for sexually transmitted diseases, bone density testing, colonoscopy, mammograms, etc.   MAMMOGRAMS:  All women over 40 years old should have a yearly mammogram. Many facilities now offer a "3D" mammogram, which may cost around $50 extra out of pocket. If possible,  we recommend you accept the option to have the 3D mammogram performed.  It both reduces the number of women who will be called back for extra views which then turn out to be normal, and it is better than the routine mammogram at detecting truly abnormal areas.    COLONOSCOPY:  Colonoscopy to screen for colon cancer is recommended for all women at age 50.  We know, you hate the idea of the prep.  We agree, BUT, having colon cancer and not knowing it is worse!!  Colon cancer so often starts as a polyp that can be seen and removed at colonscopy, which can quite literally save your life!  And if your first colonoscopy is normal and you have no family history of colon cancer, most women don't have to have it again for 10 years.  Once every ten years, you can do something that may end up saving your life, right?  We will be happy to help you get it scheduled when you are ready.    Be sure to check your insurance coverage so you understand how much it will cost.  It may be covered as a preventative service at no cost, but you should check your particular policy.    Postmenopausal Bleeding Postmenopausal bleeding is any bleeding that a woman has after she has entered menopause. Menopause is the end of a woman's fertile years. After menopause, a woman no longer ovulates and does not have menstrual periods. Therefore, she should no longer have bleeding from her vagina. Postmenopausal bleeding may have various causes, including: Menopausal  hormone therapy (MHT). Endometrial atrophy. After menopause, low estrogen hormone levels cause the membrane that lines the uterus (endometrium) to become thin. You may have bleeding as the endometrium thins. Endometrial hyperplasia. This condition is caused by excess estrogen hormones and low levels of progesterone hormones. The excess estrogen causes the endometrium to thicken, which can lead to bleeding. In some cases, this can lead to cancer of the uterus. Endometrial cancer. Noncancerous growths (polyps) on the endometrium, the lining of the uterus, or the cervix. Uterine fibroids. These are noncancerous growths in or around the uterus muscle tissue that can cause heavy bleeding. Any type of postmenopausal bleeding, even if it appears to be a typical menstrual period, should be checked by your health care provider. Treatment will depend on the cause of the bleeding. Follow these instructions at home:  Pay attention to any changes in your symptoms. Let your health care provider know about them. Avoid using tampons and douches as told by your health care provider. Change your pads regularly. Get regular pelvic exams, including Pap tests, as told by your health care provider. Take iron supplements as told by your health care provider. Take over-the-counter and prescription medicines only as told by your health care provider. Keep all follow-up visits. This is important. Contact a health care provider if: You have new bleeding from the vagina after menopause. You have pain in your abdomen. Get help right away if: You have a fever or chills. You have severe pain with bleeding. You are passing blood clots. You have heavy bleeding, need more than 1 pad an hour, and have never experienced this before. You have headaches or feel faint or dizzy. Summary Postmenopausal bleeding is any bleeding that a woman has after she has entered into menopause. Postmenopausal bleeding may have various causes.  Treatment will depend on the cause of the bleeding. Any type of postmenopausal bleeding, even if it appears to be a typical menstrual period, should be checked by your health care provider. Be sure to pay attention to any changes in your symptoms and keep all follow-up visits. This information is not intended to replace advice given to you by your health care provider. Make sure you discuss any questions you have with your health care provider. Document Revised: 07/20/2019 Document Reviewed: 07/20/2019 Elsevier Patient Education  [place].

## 2022-04-09 ENCOUNTER — Encounter: Payer: Self-pay | Admitting: Obstetrics and Gynecology

## 2022-04-09 LAB — CYTOLOGY - PAP
Chlamydia: NEGATIVE
Comment: NEGATIVE
Comment: NEGATIVE
Comment: NEGATIVE
Comment: NORMAL
Diagnosis: NEGATIVE
High risk HPV: NEGATIVE
Neisseria Gonorrhea: NEGATIVE
Trichomonas: NEGATIVE

## 2022-04-09 LAB — HEPATITIS C ANTIBODY: Hepatitis C Ab: NONREACTIVE

## 2022-04-09 LAB — HIV ANTIBODY (ROUTINE TESTING W REFLEX): HIV 1&2 Ab, 4th Generation: NONREACTIVE

## 2022-04-09 LAB — RPR: RPR Ser Ql: NONREACTIVE

## 2022-04-15 ENCOUNTER — Other Ambulatory Visit: Payer: Self-pay

## 2022-04-15 DIAGNOSIS — B9689 Other specified bacterial agents as the cause of diseases classified elsewhere: Secondary | ICD-10-CM

## 2022-04-15 MED ORDER — METRONIDAZOLE 0.75 % VA GEL: 1.0000 | Freq: Every day | VAGINAL | 0 refills | Status: AC

## 2022-05-11 ENCOUNTER — Telehealth: Payer: Self-pay

## 2022-05-11 NOTE — Telephone Encounter (Signed)
Patient was prescribed Metronidazole on 04/15/22.  She said she has not taken it yet because she was worried she might be allergic to it. Her mother was allergic to it.  No allergy reported on file.  She said it seems like a "pretty invasive antibiotic" and patient reports she is asymptomatic.  She read that it can cause a yeast infection and she will be in [place] next week and wondered if she could get a Diflucan tablet for yeast if she needed it while out of town if she decides to take it.

## 2022-05-11 NOTE — Telephone Encounter (Signed)
If Desiree Pruitt is asymptomatic, I recommend she does not treat.   If she has abnormal vaginal discharge, a fish odor to the vagina, vaginal itching/irritation, or burning with urination, I recommend she do the treatment with oral Flagyl or Metrogel.   If she is going to treat, OK for Diflucan 150 mg po x 1 and may repeat once 72 hours later for potential yeast infection.   I hope this helps to guide her.

## 2022-05-12 NOTE — Telephone Encounter (Signed)
Left message to call in voice mail.

## 2022-05-13 ENCOUNTER — Other Ambulatory Visit: Payer: Self-pay

## 2022-05-13 MED ORDER — FLUCONAZOLE 150 MG PO TABS: ORAL_TABLET | ORAL | 0 refills | Status: DC

## 2022-05-13 NOTE — Telephone Encounter (Signed)
I signed the order for the Diflucan.

## 2022-05-13 NOTE — Telephone Encounter (Signed)
Patient called. She has started treatment with Metrogel and has used it twice and is fine.  She does want the Fluconazole Rx sent to have on hand while she travels in the event of yeast sx.  I went to send the Rx and she has pop up box for contraindication because she had rash with Miconazole. Patient said she has taken Diflucan in the past with not problems.   Rx attached. Please sign is okay. Thanks

## 2022-05-19 NOTE — Progress Notes (Deleted)
GYNECOLOGY  VISIT   HPI: 57 y.o.   Single  [place]   female   G0P0000 with Patient's last menstrual period was 04/12/2018.   here for   U/S   GYNECOLOGIC HISTORY: Patient's last menstrual period was 04/12/2018. Contraception:  PMP Menopausal hormone therapy:  n/a Last mammogram:  05/16/21 Breast Density Category C, BI-RADS CATEGORY 1 Neg  Last pap smear:   04/02/21 neg: HR HPV neg, 07/10/16 neg: HR HPV neg         OB History     Gravida  0   Para  0   Term  0   Preterm  0   AB  0   Living  0      SAB  0   IAB  0   Ectopic  0   Multiple  0   Live Births  0              Patient Active Problem List   Diagnosis Date Noted   Finger pain, right 11/16/2018   Cyst of ovary 02/25/2015   Routine general medical examination at a health care facility 01/09/2013    Past Medical History:  Diagnosis Date   Finger pain, right 11/16/2018   History of COVID-19 03/22/2021    Past Surgical History:  Procedure Laterality Date   breast duct biopsy Left    BREAST LUMPECTOMY Left 02/17/1996   left duct removed around age 30   OVARIAN CYST REMOVAL Left     Current Outpatient Medications  Medication Sig Dispense Refill   albuterol (VENTOLIN HFA) 108 (90 Base) MCG/ACT inhaler SMARTSIG:1-2 Puff(s) Via Inhaler Every 4-6 Hours PRN (Patient not taking: Reported on 04/08/2022)     cetirizine (ZYRTEC) 10 MG tablet Take by mouth.     EPINEPHrine (EPIPEN 2-PAK) 0.3 mg/0.3 mL IJ SOAJ injection 0.3 mLs (0.3 mg dose). (Patient not taking: Reported on 04/08/2022)     fluconazole (DIFLUCAN) 150 MG tablet Take one tab po. May repeat in 72 hours is symptoms persist. 2 tablet 0   hydrOXYzine (ATARAX) 10 MG tablet Take by mouth.     No current facility-administered medications for this visit.     ALLERGIES: Monistat [miconazole]  Family History  Problem Relation Age of Onset   Cancer Mother        breast   Hypertension Father    Heart disease Father    Cancer Maternal  Grandmother        breast    Social History   Socioeconomic History   Marital status: Single    Spouse name: Not on file   Number of children: Not on file   Years of education: Not on file   Highest education level: Not on file  Occupational History   Occupation: professor  Tobacco Use   Smoking status: Never   Smokeless tobacco: Never  Vaping Use   Vaping Use: Never used  Substance and Sexual Activity   Alcohol use: Yes    Alcohol/week: 2.0 standard drinks of alcohol    Types: 2 Glasses of wine per week   Drug use: No   Sexual activity: Yes    Birth control/protection: Post-menopausal  Other Topics Concern   Not on file  Social History Narrative   Married - wife from [place]   Professor - Dance history and theory    Social Determinants of Radio broadcast assistant Strain: Not on [employer] Insecurity: Not on file  Transportation Needs: Not on file  Physical Activity: Not on file  Stress: Not on file  Social Connections: Not on file  Intimate Partner Violence: Not on file    Review of Systems  PHYSICAL EXAMINATION:    LMP 04/12/2018     General appearance: alert, cooperative and appears stated age Head: Normocephalic, without obvious abnormality, atraumatic Neck: no adenopathy, supple, symmetrical, trachea midline and thyroid normal to inspection and palpation Lungs: clear to auscultation bilaterally Breasts: normal appearance, no masses or tenderness, No nipple retraction or dimpling, No nipple discharge or bleeding, No axillary or supraclavicular adenopathy Heart: regular rate and rhythm Abdomen: soft, non-tender, no masses,  no organomegaly Extremities: extremities normal, atraumatic, no cyanosis or edema Skin: Skin color, texture, turgor normal. No rashes or lesions Lymph nodes: Cervical, supraclavicular, and axillary nodes normal. No abnormal inguinal nodes palpated Neurologic: Grossly normal  Pelvic: External genitalia:  no lesions               Urethra:  normal appearing urethra with no masses, tenderness or lesions              Bartholins and Skenes: normal                 Vagina: normal appearing vagina with normal color and discharge, no lesions              Cervix: no lesions                Bimanual Exam:  Uterus:  normal size, contour, position, consistency, mobility, non-tender              Adnexa: no mass, fullness, tenderness              Rectal exam: {yes no:[id number]}.  Confirms.              Anus:  normal sphincter tone, no lesions  Chaperone was present for exam:  ***  ASSESSMENT     PLAN     An After Visit Summary was printed and given to the patient.  ______ minutes face to face time of which over 50% was spent in counseling.

## 2022-06-02 ENCOUNTER — Other Ambulatory Visit: Payer: BC Managed Care – PPO

## 2022-06-02 ENCOUNTER — Other Ambulatory Visit: Payer: BC Managed Care – PPO | Admitting: Obstetrics and Gynecology

## 2022-07-06 ENCOUNTER — Other Ambulatory Visit: Payer: Self-pay | Admitting: Obstetrics and Gynecology

## 2022-07-06 DIAGNOSIS — Z0000 Encounter for general adult medical examination without abnormal findings: Secondary | ICD-10-CM

## 2022-07-07 ENCOUNTER — Ambulatory Visit
Admission: RE | Admit: 2022-07-07 | Discharge: 2022-07-07 | Disposition: A | Discharge status: Home / Self Care | Payer: BC Managed Care – PPO | Source: Ambulatory Visit | Attending: Obstetrics and Gynecology | Admitting: Obstetrics and Gynecology

## 2022-07-07 DIAGNOSIS — Z0000 Encounter for general adult medical examination without abnormal findings: Secondary | ICD-10-CM

## 2022-07-09 NOTE — Progress Notes (Unsigned)
GYNECOLOGY  VISIT   HPI: 57 y.o.   Single  [place]  female   G0P0000 with Patient's last menstrual period was 04/12/2018.   here for   U/S consult. Pt still wants to discuss HRT.  Had postmenopausal bleeding in February.   Used vaginal estrogen from [place] 1% for a couple of months.   GYNECOLOGIC HISTORY: Patient's last menstrual period was 04/12/2018. Contraception:  PMP Menopausal hormone therapy:  n/a Last mammogram:  07/07/22, ??Breast Density Category C, BI-RADS CATEGORY 1 Neg  Last pap smear:  04/08/22 - neg: HR HPV neg, 04/02/21 neg: HR HPV neg, 07/10/16 neg: HR HPV neg         OB History     Gravida  0   Para  0   Term  0   Preterm  0   AB  0   Living  0      SAB  0   IAB  0   Ectopic  0   Multiple  0   Live Births  0              Patient Active Problem List   Diagnosis Date Noted   Finger pain, right 11/16/2018   Cyst of ovary 02/25/2015   Routine general medical examination at a health care facility 01/09/2013    Past Medical History:  Diagnosis Date   Finger pain, right 11/16/2018   History of COVID-19 03/22/2021    Past Surgical History:  Procedure Laterality Date   breast duct biopsy Left    BREAST LUMPECTOMY Left 02/17/1996   left duct removed around age 64   OVARIAN CYST REMOVAL Left     Current Outpatient Medications  Medication Sig Dispense Refill   cetirizine (ZYRTEC) 10 MG tablet Take by mouth.     hydrOXYzine (ATARAX) 10 MG tablet Take by mouth.     No current facility-administered medications for this visit.     ALLERGIES: Monistat [miconazole]  Family History  Problem Relation Age of Onset   Cancer Mother        breast   Hypertension Father    Heart disease Father    Cancer Maternal Grandmother        breast    Social History   Socioeconomic History   Marital status: Single    Spouse name: Not on file   Number of children: Not on file   Years of education: Not on file   Highest education level: Not on  file  Occupational History   Occupation: professor  Tobacco Use   Smoking status: Never   Smokeless tobacco: Never  Vaping Use   Vaping Use: Never used  Substance and Sexual Activity   Alcohol use: Yes    Alcohol/week: 2.0 standard drinks of alcohol    Types: 2 Glasses of wine per week   Drug use: No   Sexual activity: Yes    Birth control/protection: Post-menopausal  Other Topics Concern   Not on file  Social History Narrative   Married - wife from [place]   Professor - Dance history and theory    Social Determinants of Corporate investment banker Strain: Not on file  Food Insecurity: Not on file  Transportation Needs: Not on file  Physical Activity: Not on file  Stress: Not on file  Social Connections: Not on file  Intimate Partner Violence: Not on file    Review of Systems  All other systems reviewed and are negative.   PHYSICAL EXAMINATION:  BP 126/80 (BP Location: Left Arm, Patient Position: Sitting, Cuff Size: Normal)   Pulse 64   Ht 5' 3.5" (1.613 m)   Wt 151 lb (68.5 kg)   LMP 04/12/2018   SpO2 99%   BMI 26.33 kg/m     General appearance: alert, cooperative and appears stated age   Pelvic US Uterus 5.65 x 3.73 x 3.2 cm.  Fibroids:  1.10 cm, 0.60 cm, and 0.66 cm.  EMS 5.42 mm.  Possible 5.6 mm fundal avascular area.  Left ovary not seen.  Right ovary 1.79 x 1.24 x 1.07 cm.  No adnexal masses.  No free fluid.   ASSESSMENT  Postmenopausal bleeding.  Thickened endometrium and possible mass. Fibroids.  Status post left ovarian cystectomy.    PLAN  [place] images and report reviewed.  Return for SIS and endometrial biopsy.    An After Visit Summary was printed and given to the patient.  ______ minutes face to face time of which over 50% was spent in counseling.

## 2022-07-22 ENCOUNTER — Other Ambulatory Visit: Payer: Self-pay | Admitting: Obstetrics and Gynecology

## 2022-07-22 DIAGNOSIS — N950 Postmenopausal bleeding: Secondary | ICD-10-CM

## 2022-07-23 ENCOUNTER — Ambulatory Visit (INDEPENDENT_AMBULATORY_CARE_PROVIDER_SITE_OTHER): Payer: BC Managed Care – PPO | Admitting: Obstetrics and Gynecology

## 2022-07-23 ENCOUNTER — Other Ambulatory Visit: Payer: Self-pay | Admitting: Obstetrics and Gynecology

## 2022-07-23 ENCOUNTER — Encounter: Payer: Self-pay | Admitting: Obstetrics and Gynecology

## 2022-07-23 ENCOUNTER — Ambulatory Visit (INDEPENDENT_AMBULATORY_CARE_PROVIDER_SITE_OTHER): Payer: BC Managed Care – PPO

## 2022-07-23 VITALS — BP 126/80 | HR 64 | Ht 63.5 in | Wt 151.0 lb

## 2022-07-23 DIAGNOSIS — N950 Postmenopausal bleeding: Secondary | ICD-10-CM

## 2022-07-23 DIAGNOSIS — R9389 Abnormal findings on diagnostic imaging of other specified body structures: Secondary | ICD-10-CM | POA: Diagnosis not present

## 2022-07-26 NOTE — Patient Instructions (Signed)
Endometrial Biopsy  An endometrial biopsy is a procedure to remove tissue samples from the endometrium, which is the lining of the uterus. The tissue that is removed can then be checked under a microscope for disease. This procedure is used to diagnose conditions such as endometrial cancer, endometrial tuberculosis, polyps, or other inflammatory conditions. This procedure may also be used to investigate uterine bleeding to determine where you are in your menstrual cycle or how your hormone levels are affecting the lining of the uterus. Tell a health care provider about: Any allergies you have. All medicines you are taking, including vitamins, herbs, eye drops, creams, and over-the-counter medicines. Any problems you or family members have had with anesthetic medicines. Any bleeding problems you have. Any surgeries you have had. Any medical conditions you have. Whether you are pregnant or may be pregnant. What are the risks? Your health care provider will talk with you about risks. These may include: Bleeding. Pelvic infection. Puncture of the wall of the uterus with the biopsy device (rare). Allergic reactions to medicines. What happens before the procedure? Keep a record of your menstrual cycles as told by your health care provider. You may need to schedule your procedure for a specific time in your cycle. Bring a sanitary pad in case you need to wear one after the procedure. Ask your health care provider about: Changing or stopping your regular medicines. These include any diabetes medicines or blood thinners you take. Taking medicines such as aspirin and ibuprofen. These medicines can thin your blood. Do not take these medicines unless your health care provider tells you to. Taking over-the-counter medicines, vitamins, herbs, and supplements. Plan to have someone take you home from the hospital or clinic. What happens during the procedure? You will lie on an exam table with your feet  and legs supported as in a pelvic exam. Your health care provider will insert an instrument into your vagina to see your cervix. Your cervix will be cleansed with an antiseptic solution. A medicine (local anesthetic) will be used to numb the cervix. A forceps instrument will be used to hold your cervix steady for the biopsy. A thin, rod-like instrument (uterine sound) will be inserted through your cervix to determine the length of your uterus and the location where the biopsy sample will be removed. A thin, flexible tube (catheter) will be inserted through your cervix and into the uterus. The catheter will be used to collect the biopsy sample from your endometrial tissue. The tube and instruments will be removed, and the tissue sample will be sent to a lab for examination. The procedure may vary among health care providers and hospitals. What happens after the procedure? Your blood pressure, heart rate, breathing rate, and blood oxygen level will be monitored until you leave the hospital or clinic. It is up to you to get the results of your procedure. Ask your health care provider, or the department that is doing the procedure, when your results will be ready. Summary An endometrial biopsy is a procedure to remove tissue samples from the endometrium, which is the lining of the uterus. This procedure is used to diagnose conditions such as endometrial cancer, endometrial tuberculosis, polyps, or other inflammatory conditions. It is up to you to get the results of your procedure. Ask your health care provider, or the department that is doing the procedure, when your results will be ready. This information is not intended to replace advice given to you by your health care provider. Make sure you   discuss any questions you have with your health care provider. Document Revised: 05/20/2021 Document Reviewed: 05/20/2021 Elsevier Patient Education  2024 Elsevier Inc.   Sonohysterogram  A  sonohysterogram is a procedure to look at the inside of the uterus (womb). This procedure uses sound waves that are sent to a computer to make images of the lining of the uterus (endometrium). To get the best images, a germ-free (sterile) saline solution is put into the uterus through the vagina. This solution is made of salt and water. You may have this procedure if you have certain reproductive problems, such as: Abnormal uterine bleeding. Infertility. Repeated miscarriage. This procedure can show what may be causing these problems. Possible causes include scarring or abnormal growths, such as fibroids or polyps, inside your uterus. It can also show if your uterus is an abnormal shape, or if there are any problems with the lining of your uterus. Tell a health care provider about: Any allergies you have. All medicines you are taking, including vitamins, herbs, eye drops, creams, and over-the-counter medicines. The date of the first day of your last menstrual period or whether you are pregnant or may be pregnant. The procedure will not be done if you are pregnant. Any signs of infection, such as fever, pain in your lower abdomen, or abnormal discharge from your vagina. The procedure will not be done if you have an infection. Any bleeding problems you have. Any medical conditions or surgeries you have had. Any problems you or family members have had with anesthetic medicines. What are the risks? Generally, this is a safe procedure. However, problems may occur, including: Pain or fever a day or two after the procedure. Increased vaginal discharge. Infection. What happens before the procedure? You may be asked to take a pregnancy test. This is usually in the form of a urine test. You may be given medicine to stop any abnormal bleeding. Your health care provider may have you take an over-the-counter pain medicine. You will be asked to empty your bladder. You will be asked to undress from the  waist down. You will lie down on the exam table with your feet in stirrups or with your knees bent and your feet flat on the table. You may have a pelvic exam. What happens during the procedure? You will have a transvaginal ultrasound. This is a test that uses sound waves to take pictures of the female genital tract. The pictures are taken with a device, called a transducer, that is placed in the vagina. For this test: The transducer will have a slippery substance put on it and will be placed into your vagina. The transducer will be positioned to send sound waves to your uterus. The sound waves will be sent to a computer and turned into images, which your health care provider will see during the procedure. The transducer will be removed from your vagina. Fluid will be put into your uterus. To do this: An instrument called a speculum will be put into your vagina to widen the opening. A swab with germ-killing (antiseptic) saline solution will be used to clean the opening to your uterus (cervix). A long, thin tube (catheter) will be placed through your cervix into your uterus, and the speculum will be removed. The transvaginal ultrasound will be repeated. The ultrasound transducer will be put into your vagina again to take more images. Your uterus will be slowly filled with a sterile saline solution through the catheter. You may feel some cramping. If your fallopian tubes  need to be checked, fluid containing air bubbles will be placed. The transducer and catheter will be removed. The procedure may vary among health care providers and hospitals. What can I expect after the procedure? Your blood pressure, heart rate, breathing rate, and blood oxygen level will be monitored until you leave the hospital or clinic. It is up to you to get the results of your procedure. Ask your health care provider, or the department that is doing the procedure, when your results will be ready. Contact a health care  provider if you have: Chills or a fever. Pain or cramping that does not go away even with medicine. An increase in vaginal discharge. Vaginal discharge that is yellow or green in color and has a bad smell. Nausea. Get help right away if you have: Severe pain in your abdomen. Heavy bleeding from your vagina. These symptoms may be an emergency. Get help right away. Call 911. Do not wait to see if the symptoms will go away. Do not drive yourself to the hospital. Summary A sonohysterogram is a procedure that creates images of the inside of the uterus. You may have this procedure if you have certain reproductive problems, such as abnormal bleeding, infertility, or repeated miscarriage. You may need to have a pelvic exam and take a pregnancy test before this procedure. The procedure will not be done if you are pregnant or have an infection. It is up to you to get the results of your procedure. Ask your health care provider, or the department that is doing the procedure, when your results will be ready. This information is not intended to replace advice given to you by your health care provider. Make sure you discuss any questions you have with your health care provider. Document Revised: 04/08/2021 Document Reviewed: 04/08/2021 Elsevier Patient Education  2024 [employer].

## 2022-09-08 ENCOUNTER — Other Ambulatory Visit: Payer: BC Managed Care – PPO

## 2022-09-08 ENCOUNTER — Other Ambulatory Visit: Payer: BC Managed Care – PPO | Admitting: Obstetrics and Gynecology

## 2022-10-05 ENCOUNTER — Telehealth: Payer: Self-pay | Admitting: *Deleted

## 2022-10-05 NOTE — Progress Notes (Deleted)
GYNECOLOGY  VISIT   HPI: 57 y.o.   Single  [place]  female   [id number] with Patient's last menstrual period was 04/12/2018.   here for   endo bx  GYNECOLOGIC HISTORY: Patient's last menstrual period was 04/12/2018. Contraception:  PMP Menopausal hormone therapy:  n/a Last mammogram:  07/07/22, Breast Density Category C, BI-RADS CATEGORY 1  Last pap smear:   04/08/22 - neg: HR HPV neg, 04/02/21 neg: HR HPV neg, 07/10/16 neg: HR HPV neg         OB History     Gravida  0   Para  0   Term  0   Preterm  0   AB  0   Living  0      SAB  0   IAB  0   Ectopic  0   Multiple  0   Live Births  0              Patient Active Problem List   Diagnosis Date Noted   Finger pain, right 11/16/2018   Cyst of ovary 02/25/2015   Routine general medical examination at a health care facility 01/09/2013    Past Medical History:  Diagnosis Date   Finger pain, right 11/16/2018   History of COVID-19 03/22/2021    Past Surgical History:  Procedure Laterality Date   breast duct biopsy Left    BREAST LUMPECTOMY Left 02/17/1996   left duct removed around age 57   OVARIAN CYST REMOVAL Left     Current Outpatient Medications  Medication Sig Dispense Refill   cetirizine (ZYRTEC) 10 MG tablet Take by mouth.     hydrOXYzine (ATARAX) 10 MG tablet Take by mouth.     No current facility-administered medications for this visit.     ALLERGIES: Monistat [miconazole]  Family History  Problem Relation Age of Onset   Cancer Mother        breast   Hypertension Father    Heart disease Father    Cancer Maternal Grandmother        breast    Social History   Socioeconomic History   Marital status: Single    Spouse name: Not on file   Number of children: Not on file   Years of education: Not on file   Highest education level: Not on file  Occupational History   Occupation: professor  Tobacco Use   Smoking status: Never   Smokeless tobacco: Never  Vaping Use   Vaping status:  Never Used  Substance and Sexual Activity   Alcohol use: Yes    Alcohol/week: 2.0 standard drinks of alcohol    Types: 2 Glasses of wine per week   Drug use: No   Sexual activity: Yes    Birth control/protection: Post-menopausal  Other Topics Concern   Not on file  Social History Narrative   Married - wife from [place]   Professor - Dance history and theory    Social Determinants of Health   Financial Resource Strain: Low Risk  (07/22/2022)   Received from [employer], [facility]   Overall Financial Resource Strain (CARDIA)    Difficulty of Paying Living Expenses: Not hard at all  Food Insecurity: No Food Insecurity (07/22/2022)   Received from [facility], [facility]   Hunger Vital Sign    Worried About Running Out of Food in the Last Year: Never true    Ran Out of Food in the Last Year: Never true  Transportation Needs: No Transportation  Needs (07/22/2022)   Received from [facility], [facility]   [facility] - Transportation    Lack of Transportation (Medical): No    Lack of Transportation (Non-Medical): No  Physical Activity: Not on file  Stress: Not on file  Social Connections: Unknown (07/01/2021)   Received from [facility], [facility]   Social Network    Social Network: Not on file  Intimate Partner Violence: Unknown (05/23/2021)   Received from [facility], [facility]   HITS    Physically Hurt: Not on file    Insult or Talk Down To: Not on file    Threaten Physical Harm: Not on file    Scream or Curse: Not on file    Review of Systems  PHYSICAL EXAMINATION:    LMP 04/12/2018     General appearance: alert, cooperative and appears stated age Head: Normocephalic, without obvious abnormality, atraumatic Neck: no adenopathy, supple, symmetrical, trachea midline and thyroid normal to inspection and palpation Lungs: clear to auscultation bilaterally Breasts: normal appearance, no masses or tenderness, No nipple retraction or dimpling, No nipple  discharge or bleeding, No axillary or supraclavicular adenopathy Heart: regular rate and rhythm Abdomen: soft, non-tender, no masses,  no organomegaly Extremities: extremities normal, atraumatic, no cyanosis or edema Skin: Skin color, texture, turgor normal. No rashes or lesions Lymph nodes: Cervical, supraclavicular, and axillary nodes normal. No abnormal inguinal nodes palpated Neurologic: Grossly normal  Pelvic: External genitalia:  no lesions              Urethra:  normal appearing urethra with no masses, tenderness or lesions              Bartholins and Skenes: normal                 Vagina: normal appearing vagina with normal color and discharge, no lesions              Cervix: no lesions                Bimanual Exam:  Uterus:  normal size, contour, position, consistency, mobility, non-tender              Adnexa: no mass, fullness, tenderness              Rectal exam: {yes no:[id number]}.  Confirms.              Anus:  normal sphincter tone, no lesions  Chaperone was present for exam:  ***  ASSESSMENT     PLAN     An After Visit Summary was printed and given to the patient.  ______ minutes face to face time of which over 50% was spent in counseling.

## 2022-10-05 NOTE — Telephone Encounter (Signed)
Call returned to patient. Patient is scheduled for EMB on 8/20 at 3pm. Patient asking if necessary to proceed with EMB prior to [facility] or can these be done separately? Can she Repeat PUS?   Spoke with patient.  PUS 07/23/22. Advised repeating PUS at this point may not be beneficial with unexplained PMB and thickened endometrium, additional evaluation needed. Reviewed EMB and SHGM and purpose of each procedure. Advised they can be completed at separate visits. Questions answered. Advised to avoid delay in care and/or treatment of possibly identifying  endometrial cancers, recommended proceeding with EMB, Dr. Edward Jolly can further advise once results are back from pathology. Advised ultimately her decision.   Patient states she will  keep procedure as scheduled for 8/20 and will return call to cancel prior to 3pm today if she does not want to proceed at this time. Advised I will forward to Dr. Edward Jolly to review and I will f/u if any additional recommendations. Patient appreciative of call.   Routing to Dr. Edward Jolly for final review.

## 2022-10-06 ENCOUNTER — Other Ambulatory Visit: Payer: BC Managed Care – PPO

## 2022-10-06 ENCOUNTER — Other Ambulatory Visit: Payer: BC Managed Care – PPO | Admitting: Obstetrics and Gynecology

## 2022-10-06 ENCOUNTER — Ambulatory Visit: Payer: BC Managed Care – PPO | Admitting: Obstetrics and Gynecology

## 2022-10-06 NOTE — Telephone Encounter (Signed)
Per review of EPIC, patient cancelled her appt for 10/06/22.   Routing to Dr. Marjorie Smolder

## 2022-10-06 NOTE — Telephone Encounter (Signed)
Please advise if [facility] is able to do a sonohysterogram.

## 2022-10-06 NOTE — Telephone Encounter (Signed)
I agree with patient keeping her appointment for her endometrial biopsy today.

## 2022-10-07 NOTE — Telephone Encounter (Signed)
Spoke with Desiree at [facility]. They are not currently scheduling SHGM.

## 2022-10-09 NOTE — Telephone Encounter (Signed)
Please send letter to patient recommending proceeding with the endometrial biopsy for evaluation of postmenopausal bleeding and thickened endometrium.   We are currently not able to offer sonohysterogram at our office or with [facility].

## 2022-10-12 NOTE — Telephone Encounter (Signed)
Letter pending. Routing to Dr. Edward Jolly to review.

## 2022-10-15 NOTE — Telephone Encounter (Signed)
I signed the letter and gave to Triage to mail to the patient.

## 2022-10-15 NOTE — Telephone Encounter (Signed)
Letter printed. Copy to Dr. Edward Jolly to review and sign. Letter to be mailed to address on file.   Encounter closed.

## 2022-10-16 NOTE — Telephone Encounter (Signed)
Letter mailed

## 2023-02-17 HISTORY — PX: KNEE SURGERY: SHX244

## 2023-07-15 ENCOUNTER — Ambulatory Visit
Admission: RE | Admit: 2023-07-15 | Discharge: 2023-07-15 | Disposition: A | Discharge status: Home / Self Care | Source: Ambulatory Visit | Attending: Family Medicine | Admitting: Family Medicine

## 2023-07-15 ENCOUNTER — Other Ambulatory Visit: Payer: Self-pay | Admitting: Family Medicine

## 2023-07-15 DIAGNOSIS — Z1231 Encounter for screening mammogram for malignant neoplasm of breast: Secondary | ICD-10-CM

## 2023-08-06 ENCOUNTER — Encounter (HOSPITAL_COMMUNITY): Payer: Self-pay | Admitting: Emergency Medicine

## 2023-08-06 ENCOUNTER — Other Ambulatory Visit: Payer: Self-pay

## 2023-08-06 ENCOUNTER — Emergency Department (HOSPITAL_COMMUNITY)

## 2023-08-06 ENCOUNTER — Emergency Department (HOSPITAL_COMMUNITY)
Admission: EM | Admit: 2023-08-06 | Discharge: 2023-08-06 | Disposition: A | Discharge status: Home / Self Care | Attending: Emergency Medicine | Admitting: Emergency Medicine

## 2023-08-06 DIAGNOSIS — W11XXXA Fall on and from ladder, initial encounter: Secondary | ICD-10-CM | POA: Diagnosis not present

## 2023-08-06 DIAGNOSIS — Z8616 Personal history of COVID-19: Secondary | ICD-10-CM | POA: Insufficient documentation

## 2023-08-06 DIAGNOSIS — M25562 Pain in left knee: Secondary | ICD-10-CM | POA: Insufficient documentation

## 2023-08-06 MED ORDER — ACETAMINOPHEN 500 MG PO TABS
1000.0000 mg | ORAL_TABLET | Freq: Once | ORAL | Status: AC
  Administered 2023-08-06: 1000 mg via ORAL
  Filled 2023-08-06: qty 2

## 2023-08-06 MED ORDER — KETOROLAC TROMETHAMINE 15 MG/ML IJ SOLN
15.0000 mg | Freq: Once | INTRAMUSCULAR | Status: DC
  Filled 2023-08-06: qty 1

## 2023-08-06 MED ORDER — OXYCODONE HCL 5 MG PO TABS
5.0000 mg | ORAL_TABLET | Freq: Once | ORAL | Status: DC
  Filled 2023-08-06: qty 1

## 2023-08-06 MED ORDER — OXYCODONE HCL 5 MG PO TABS: 5.0000 mg | ORAL_TABLET | ORAL | 0 refills | Status: DC | PRN

## 2023-08-06 NOTE — Progress Notes (Signed)
 Orthopedic Tech Progress Note Patient Details:  Desiree Pruitt 19-Dec-1965 [id number]  Ortho Devices Type of Ortho Device: Crutches Ortho Device/Splint Interventions: Ordered, Adjustment   Post Interventions Instructions Provided: Poper ambulation with device  Treesa Mccully E Lior Hoen 08/06/2023, 9:02 PM

## 2023-08-06 NOTE — ED Provider Notes (Signed)
 [place] EMERGENCY DEPARTMENT AT [facility] Provider Note  CSN: [id number] Arrival date & time: 08/06/23 1859  Chief Complaint(s) Fall  HPI Desiree Pruitt is a 58 y.o. female here today after she was stepping down from a ladder, was on the second rung from the floor, missed the bottom rung and step straight down on the floor and had immediate pain in her knee.  She felt something pop.   Past Medical History Past Medical History:  Diagnosis Date   Finger pain, right 11/16/2018   History of COVID-19 03/22/2021   Patient Active Problem List   Diagnosis Date Noted   Finger pain, right 11/16/2018   Cyst of ovary 02/25/2015   Routine general medical examination at a health care facility 01/09/2013   Home Medication(s) Prior to Admission medications   Medication Sig Start Date End Date Taking? Authorizing Provider  oxyCODONE (ROXICODONE) 5 MG immediate release tablet Take 1 tablet (5 mg total) by mouth every 4 (four) hours as needed for severe pain (pain score 7-10). 08/06/23  Yes Afton Horse T, DO  cetirizine (ZYRTEC) 10 MG tablet Take by mouth.    [provider]  hydrOXYzine  (ATARAX ) 10 MG tablet Take by mouth. 02/04/22   [provider]                                                                                                                                    Past Surgical History Past Surgical History:  Procedure Laterality Date   breast duct biopsy Left    BREAST LUMPECTOMY Left 02/17/1996   left duct removed around age 2   OVARIAN CYST REMOVAL Left    Family History Family History  Problem Relation Age of Onset   Cancer Mother        breast   Hypertension Father    Heart disease Father    Cancer Maternal Grandmother        breast    Social History Social History   Tobacco Use   Smoking status: Never   Smokeless tobacco: Never  Vaping Use   Vaping status: Never Used  Substance Use Topics   Alcohol use: Yes     Alcohol/week: 2.0 standard drinks of alcohol    Types: 2 Glasses of wine per week   Drug use: No   Allergies Monistat [miconazole]  Review of Systems Review of Systems  Physical Exam Vital Signs  I have reviewed the triage vital signs BP (!) 141/96 (BP Location: Right Arm)   Pulse 99   Temp 97.9 F (36.6 C) (Oral)   Resp 18   LMP 04/12/2018   SpO2 98%   Physical Exam Vitals and nursing note reviewed.   Cardiovascular:     Pulses: Normal pulses.  Pulmonary:     Effort: Pulmonary effort is normal.   Musculoskeletal:        General: Swelling present.  Comments: Negative anterior and posterior drawer test.  Positive McMurry test.  Patient with pain with knee flexion, is able to extend knee.  No tibial plateau tenderness, no distal femur tenderness   Neurological:     Mental Status: She is alert.     ED Results and Treatments Labs (all labs ordered are listed, but only abnormal results are displayed) Labs Reviewed - No data to display                                                                                                                        Radiology DG Knee Complete 4 Views Left Result Date: 08/06/2023 CLINICAL DATA:  Status post fall. EXAM: LEFT KNEE - COMPLETE 4+ VIEW COMPARISON:  None Available. FINDINGS: No evidence of an acute fracture or dislocation. No evidence of arthropathy or other focal bone abnormality. A small to moderate sized suprapatellar effusion is seen. IMPRESSION: 1. No acute fracture or dislocation. 2. Small to moderate sized suprapatellar effusion. Electronically Signed   By: Virgle Grime M.D.   On: 08/06/2023 20:27    Pertinent labs & imaging results that were available during my care of the patient were reviewed by me and considered in my medical decision making (see MDM for details).  Medications Ordered in ED Medications  oxyCODONE (Oxy IR/ROXICODONE) immediate release tablet 5 mg (0 mg Oral Hold 08/06/23 2011)  ketorolac  (TORADOL) 15 MG/ML injection 15 mg (0 mg Intravenous Hold 08/06/23 2011)  acetaminophen (TYLENOL) tablet 1,000 mg (1,000 mg Oral Given 08/06/23 2003)                                                                                                                                     Procedures Procedures  (including critical care time)  Medical Decision Making / ED Course   This patient presents to the ED for concern of knee pain, this involves an extensive number of treatment options, and is a complaint that carries with it a high risk of complications and morbidity.  The differential diagnosis includes meniscus injury, ligamentous injury, less likely fracture.  MDM: Based on the patient's mechanism of injury, concern is for a meniscal or ligamentous injury.  Obtain plain films of the patient's knee which is negative aside from small effusion.  Provided the patient with analgesia, knee immobilizer.  Believe is likely meniscus injury, she is appropriate for follow-up with orthopedic surgery.  Medication sent to the patient.  All questions with the patient's patient's partner who is at bedside answered.   Additional history obtained: -Additional history obtained from partner at bedside -External records from outside source obtained and reviewed including: Chart review including previous notes, labs, imaging, consultation notes   Lab Tests: -I ordered, reviewed, and interpreted labs.   The pertinent results include:   Labs Reviewed - No data to display       Imaging Studies ordered: I ordered imaging studies including knee x-ray I independently visualized and interpreted imaging. I agree with the radiologist interpretation   Medicines ordered and prescription drug management: Meds ordered this encounter  Medications   oxyCODONE (Oxy IR/ROXICODONE) immediate release tablet 5 mg    Refill:  0   ketorolac (TORADOL) 15 MG/ML injection 15 mg   acetaminophen (TYLENOL) tablet 1,000 mg    oxyCODONE (ROXICODONE) 5 MG immediate release tablet    Sig: Take 1 tablet (5 mg total) by mouth every 4 (four) hours as needed for severe pain (pain score 7-10).    Dispense:  6 tablet    Refill:  0    -I have reviewed the patients home medicines and have made adjustments as needed  Cardiac Monitoring: The patient was maintained on a cardiac monitor.  I personally viewed and interpreted the cardiac monitored which showed an underlying rhythm of: Normal sinus rhythm   Reevaluation: After the interventions noted above, I reevaluated the patient and found that they have :improved  Co morbidities that complicate the patient evaluation  Past Medical History:  Diagnosis Date   Finger pain, right 11/16/2018   History of COVID-19 03/22/2021      Dispostion: I considered admission for this patient, however she is appropriate for outpatient follow-up     Final Clinical Impression(s) / ED Diagnoses Final diagnoses:  Acute pain of left knee     @PCDICTATION @    Afton Horse T, DO 08/06/23 2052

## 2023-08-06 NOTE — Discharge Instructions (Addendum)
 While you were in the emergency room, you had an x-ray of your knee that was normal.  Like we discussed, I believe that you likely have injured your meniscus.  I have included the telephone number for an orthopedic doctor.  Tomorrow or Monday morning, give them a call and set a follow-up appointment.  For pain, you can take 1000 mg of Tylenol every 8 hours, 400 mg of ibuprofen every 6 hours.  You can take 5 mg of oxycodone as needed every 6 hours for severe pain.

## 2023-08-06 NOTE — ED Triage Notes (Signed)
 Pt BIBA coming from home, fell from ladder 3-68ft. Injured left knee, can't support any weight, swelling noted. No thinners, no LOC.  PTA 140/90 HR 99 650 tylenol 20 L AC

## 2023-11-24 LAB — COLOGUARD: COLOGUARD: NEGATIVE

## 2024-02-28 ENCOUNTER — Ambulatory Visit: Admitting: Obstetrics and Gynecology

## 2024-03-02 NOTE — Progress Notes (Signed)
 "  59 y.o. G0P0000 Single [place] female here for annual exam.    Had postmenopausal bleeding and had endometrial thickening on ultrasound here 07/27/22.  Had EMB done with Dr. Philipp office.  Normal per patient.   Patient was previously interested in doing treatment for vaginal atrophy.   Had knee surgery.   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    PCP: Pura Lenis, MD   Patient's last menstrual period was 04/12/2018.           Sexually active: Yes.   Female partner.  The current method of family planning is post menopausal status.    Menopausal hormone therapy:  n/a Exercising: Yes.    Physical therapy - knee surgery summer 2025 Smoker:  no  OB History  Gravida Para Term Preterm AB Living  0 0 0 0 0 0  SAB IAB Ectopic Multiple Live Births  0 0 0 0 0     HEALTH MAINTENANCE: Last 2 paps:  04/08/22 neg, HR HPV neg, 04/02/21 neg, HR HPV neg  History of abnormal Pap or positive HPV:  no Mammogram:   07/15/23 Breast Density Cat C, BIRADS cat 1 neg  Colonoscopy:  Cologuard 11/19/23 - negative. Bone Density:  n/a  Result  n/a   Immunization History  Administered Date(s) Administered   Influenza,inj,Quad PF,6+ Mos 01/09/2013   Influenza-Unspecified 11/16/2016, 11/18/2019   PFIZER(Purple Top)SARS-COV-2 Vaccination 04/27/2019, 05/22/2019, 12/26/2019   Pfizer Covid-19 Vaccine Bivalent Booster 21yrs & up 11/18/2020   Pfizer(Comirnaty)Fall Seasonal Vaccine 12 years and older 11/10/2021   Tdap 02/24/2014      reports that she has never smoked. She has never used smokeless tobacco. She reports current alcohol use of about 2.0 standard drinks of alcohol per week. She reports that she does not use drugs.  Past Medical History:  Diagnosis Date   Finger pain, right 11/16/2018   History of COVID-19 03/22/2021    Past Surgical History:  Procedure Laterality Date   breast duct biopsy Left    BREAST LUMPECTOMY Left 02/17/1996   left duct removed around age 83    KNEE SURGERY  2025   OVARIAN CYST REMOVAL Left     Current Outpatient Medications  Medication Sig Dispense Refill   cetirizine (ZYRTEC) 10 MG tablet Take by mouth.     [START ON 03/06/2024] Estradiol  (VAGIFEM ) 10 MCG TABS vaginal tablet Place 1 tablet (10 mcg total) vaginally 2 (two) times a week. Use every night before bed for two weeks when you first begin this medicine, then after the first two weeks, begin using it twice a week. 34 tablet 3   meloxicam (MOBIC) 15 MG tablet Take 15 mg by mouth daily as needed.     VITAMIN D PO Take by mouth.     No current facility-administered medications for this visit.    ALLERGIES: Cat dander, Miconazole nitrate, and Monistat [miconazole]  Family History  Problem Relation Age of Onset   Cancer Mother        breast   Hypertension Father    Heart disease Father    Cancer Maternal Grandmother        breast    Review of Systems  All other systems reviewed and are negative.   PHYSICAL EXAM:  BP 114/74 (BP Location: Left Arm, Patient Position: Sitting)   Pulse 94   Wt 163 lb (73.9 kg)   LMP 04/12/2018   SpO2 97%   BMI 28.42 kg/m     General appearance: alert,  cooperative and appears stated age Head: normocephalic, without obvious abnormality, atraumatic Neck: no adenopathy, supple, symmetrical, trachea midline and thyroid normal to inspection and palpation Lungs: clear to auscultation bilaterally Breasts: normal appearance, no masses or tenderness, No nipple retraction or dimpling, No nipple discharge or bleeding, No axillary adenopathy Heart: regular rate and rhythm Abdomen: soft, non-tender; no masses, no organomegaly Extremities: extremities normal, atraumatic, no cyanosis or edema Skin: skin color, texture, turgor normal. No rashes or lesions Lymph nodes: cervical, supraclavicular, and axillary nodes normal. Neurologic: grossly normal  Pelvic: External genitalia:  no lesions              No abnormal inguinal nodes palpated.               Urethra:  normal appearing urethra with no masses, tenderness or lesions              Bartholins and Skenes: normal                 Vagina: normal appearing vagina with normal color and discharge, no lesions              Cervix: no lesions              Pap taken: no Bimanual Exam:  Uterus:  normal size, contour, position, consistency, mobility, non-tender              Adnexa: no mass, fullness, tenderness              Rectal exam: yes.  Confirms.              Anus:  normal sphincter tone, no lesions  Chaperone was present for exam:  Heinz HERO, CMA  ASSESSMENT: Well woman visit with gynecologic exam. Vaginal atrophy.  FH breast cancer.  PHQ-2-9: 0  PLAN: Mammogram screening discussed. Self breast awareness reviewed. Pap and HRV collected:  no.  Due in 2029.  Guidelines for Calcium, Vitamin D, regular exercise program including cardiovascular and weight bearing exercise. Medications:  Vagifem  10 mcg pv at hs x 2 weeks, and then place twice weekly.  #34, RF 3 refills of 24.  Labs with PCP.  Will get copy of her endometrial biopsy.  Follow up:  yearly and prn.        "

## 2024-03-03 ENCOUNTER — Ambulatory Visit: Admitting: Obstetrics and Gynecology

## 2024-03-03 ENCOUNTER — Encounter: Payer: Self-pay | Admitting: Obstetrics and Gynecology

## 2024-03-03 VITALS — BP 114/74 | HR 94 | Wt 163.0 lb

## 2024-03-03 DIAGNOSIS — N952 Postmenopausal atrophic vaginitis: Secondary | ICD-10-CM | POA: Diagnosis not present

## 2024-03-03 DIAGNOSIS — Z01419 Encounter for gynecological examination (general) (routine) without abnormal findings: Secondary | ICD-10-CM

## 2024-03-03 DIAGNOSIS — Z1331 Encounter for screening for depression: Secondary | ICD-10-CM | POA: Diagnosis not present

## 2024-03-03 MED ORDER — ESTRADIOL 10 MCG VA TABS: 1.0000 | ORAL_TABLET | VAGINAL | 3 refills | Status: AC

## 2024-03-03 NOTE — Patient Instructions (Signed)
